# Patient Record
Sex: Male | Born: 1985 | Hispanic: Yes | Marital: Single | Smoking: Former smoker
Health system: Southern US, Community
[De-identification: ages and names within clinical notes are randomized; demographics above are authoritative.]

## PROBLEM LIST (undated history)

## (undated) DIAGNOSIS — G8929 Other chronic pain: Secondary | ICD-10-CM

## (undated) DIAGNOSIS — K5190 Ulcerative colitis, unspecified, without complications: Secondary | ICD-10-CM

## (undated) DIAGNOSIS — R109 Unspecified abdominal pain: Secondary | ICD-10-CM

---

## 2005-12-12 ENCOUNTER — Emergency Department (HOSPITAL_COMMUNITY): Admission: EM | Admit: 2005-12-12 | Discharge: 2005-12-13 | Payer: Self-pay | Admitting: Emergency Medicine

## 2006-10-20 ENCOUNTER — Emergency Department (HOSPITAL_COMMUNITY): Admission: EM | Admit: 2006-10-20 | Discharge: 2006-10-20 | Payer: Self-pay | Admitting: Emergency Medicine

## 2008-03-28 ENCOUNTER — Emergency Department (HOSPITAL_COMMUNITY): Admission: EM | Admit: 2008-03-28 | Discharge: 2008-03-28 | Payer: Self-pay | Admitting: Emergency Medicine

## 2013-10-11 ENCOUNTER — Emergency Department (HOSPITAL_COMMUNITY)
Admission: EM | Admit: 2013-10-11 | Discharge: 2013-10-11 | Disposition: A | Discharge status: Home / Self Care | Payer: Self-pay | Attending: Emergency Medicine | Admitting: Emergency Medicine

## 2013-10-11 ENCOUNTER — Encounter (HOSPITAL_COMMUNITY): Payer: Self-pay | Admitting: Emergency Medicine

## 2013-10-11 DIAGNOSIS — K5289 Other specified noninfective gastroenteritis and colitis: Secondary | ICD-10-CM | POA: Insufficient documentation

## 2013-10-11 DIAGNOSIS — K529 Noninfective gastroenteritis and colitis, unspecified: Secondary | ICD-10-CM

## 2013-10-11 DIAGNOSIS — F17200 Nicotine dependence, unspecified, uncomplicated: Secondary | ICD-10-CM | POA: Insufficient documentation

## 2013-10-11 DIAGNOSIS — R000 Tachycardia, unspecified: Secondary | ICD-10-CM | POA: Insufficient documentation

## 2013-10-11 LAB — POCT I-STAT, CHEM 8
BUN: 13 mg/dL (ref 6–23)
CALCIUM ION: 1.25 mmol/L — AB (ref 1.12–1.23)
CHLORIDE: 101 meq/L (ref 96–112)
Creatinine, Ser: 1.1 mg/dL (ref 0.50–1.35)
GLUCOSE: 80 mg/dL (ref 70–99)
HCT: 47 % (ref 39.0–52.0)
HEMOGLOBIN: 16 g/dL (ref 13.0–17.0)
POTASSIUM: 4.3 meq/L (ref 3.7–5.3)
Sodium: 140 mEq/L (ref 137–147)
TCO2: 29 mmol/L (ref 0–100)

## 2013-10-11 LAB — CG4 I-STAT (LACTIC ACID): Lactic Acid, Venous: 1.59 mmol/L (ref 0.5–2.2)

## 2013-10-11 MED ORDER — ONDANSETRON HCL 4 MG/2ML IJ SOLN
4.0000 mg | Freq: Once | INTRAMUSCULAR | Status: AC
  Administered 2013-10-11: 4 mg via INTRAVENOUS
  Filled 2013-10-11: qty 2

## 2013-10-11 MED ORDER — ONDANSETRON 4 MG PO TBDP: ORAL_TABLET | ORAL | Status: DC

## 2013-10-11 MED ORDER — SODIUM CHLORIDE 0.9 % IV BOLUS (SEPSIS)
2000.0000 mL | Freq: Once | INTRAVENOUS | Status: AC
  Administered 2013-10-11: 2000 mL via INTRAVENOUS

## 2013-10-11 NOTE — ED Notes (Signed)
Pt presents to department for evaluation of nausea/vomiting and L lower quadrant abdominal pain. Ongoing x2 days. Denies urinary symptoms. Pt is conscious alert and oriented x4. States 9/10 abdominal pain upon arrival to ED. No signs of acute distress noted.

## 2013-10-11 NOTE — ED Notes (Signed)
Lactic acid results shown to Dr. Walden 

## 2013-10-11 NOTE — ED Provider Notes (Signed)
CSN: [id number]     Arrival date & time 10/11/13  0707 History   First MD Initiated Contact with Patient 10/11/13 0715     Chief Complaint  Patient presents with  . Emesis  . Abdominal Pain     (Consider location/radiation/quality/duration/timing/severity/associated sxs/prior Treatment) Patient is a 28 y.o. male presenting with vomiting and abdominal pain. The history is provided by the patient.  Emesis Severity:  Moderate Duration:  2 days Timing:  Constant Number of daily episodes:  5-6 Quality:  Stomach contents Able to tolerate:  Liquids How soon after eating does vomiting occur:  5 minutes Progression:  Improving Chronicity:  New Recent urination:  Normal Relieved by:  Nothing Worsened by:  Nothing tried Associated symptoms: no abdominal pain, no diarrhea, no fever, no myalgias and no URI   Abdominal Pain Associated symptoms: vomiting   Associated symptoms: no chest pain, no cough, no diarrhea, no fever and no shortness of breath     History reviewed. No pertinent past medical history. History reviewed. No pertinent past surgical history. History reviewed. No pertinent family history. History  Substance Use Topics  . Smoking status: Current Some Day Smoker    Types: Cigarettes  . Smokeless tobacco: Not on file  . Alcohol Use: Yes     Comment: social    Review of Systems  Constitutional: Negative for fever.  Respiratory: Negative for cough and shortness of breath.   Cardiovascular: Negative for chest pain and leg swelling.  Gastrointestinal: Positive for vomiting. Negative for abdominal pain and diarrhea.  Musculoskeletal: Negative for myalgias.  All other systems reviewed and are negative.      Allergies  Review of patient's allergies indicates no known allergies.  Home Medications  No current outpatient prescriptions on file. BP 120/71  Pulse 117  Temp(Src) 98.5 F (36.9 C) (Oral)  Resp 18  Wt 194 lb (87.998 kg)  SpO2 100% Physical Exam   Constitutional: He is oriented to person, place, and time. He appears well-developed and well-nourished. No distress.  HENT:  Head: Normocephalic and atraumatic.  Mouth/Throat: No oropharyngeal exudate.  Eyes: EOM are normal. Pupils are equal, round, and reactive to light.  Neck: Normal range of motion. Neck supple.  Cardiovascular: Regular rhythm.  Tachycardia present.  Exam reveals no friction rub.   No murmur heard. Pulmonary/Chest: Effort normal and breath sounds normal. No respiratory distress. He has no wheezes. He has no rales.  Abdominal: He exhibits no distension. There is no tenderness. There is no rebound.  Musculoskeletal: Normal range of motion. He exhibits no edema.  Neurological: He is alert and oriented to person, place, and time.  Skin: He is not diaphoretic.    ED Course  Procedures (including critical care time) Labs Review Labs Reviewed - No data to display Imaging Review No results found.  EKG Interpretation   None       MDM   Final diagnoses:  Gastroenteritis    67M presents with vomiting for the past 36 hours. Roughly 12 episodes of vomiting. No abdominal pain. No fevers. No diarrhea. Sick contacts: sister and mother with similar symptoms. No medical problems. Here, afebrile, tachycardic in the 120s on my exam. Belly benign, no focal tenderness. Concern for dehydration, viral gastroenteritis. Will give zofran, fluids, check basic labs.  HR improved after 2 liters. Feeling better. Given zofran, stable for discharge.   Dagmar HaitWilliam Ronan Duecker, MD 10/11/13 [phone]

## 2013-10-11 NOTE — Discharge Instructions (Signed)
Viral Gastroenteritis °Viral gastroenteritis is also known as stomach flu. This condition affects the stomach and intestinal tract. It can cause sudden diarrhea and vomiting. The illness typically lasts 3 to 8 days. Most people develop an immune response that eventually gets rid of the virus. While this natural response develops, the virus can make you quite ill. °CAUSES  °Many different viruses can cause gastroenteritis, such as rotavirus or noroviruses. You can catch one of these viruses by consuming contaminated food or water. You may also catch a virus by sharing utensils or other personal items with an infected person or by touching a contaminated surface. °SYMPTOMS  °The most common symptoms are diarrhea and vomiting. These problems can cause a severe loss of body fluids (dehydration) and a body salt (electrolyte) imbalance. Other symptoms may include: °· Fever. °· Headache. °· Fatigue. °· Abdominal pain. °DIAGNOSIS  °Your caregiver can usually diagnose viral gastroenteritis based on your symptoms and a physical exam. A stool sample may also be taken to test for the presence of viruses or other infections. °TREATMENT  °This illness typically goes away on its own. Treatments are aimed at rehydration. The most serious cases of viral gastroenteritis involve vomiting so severely that you are not able to keep fluids down. In these cases, fluids must be given through an intravenous line (IV). °HOME CARE INSTRUCTIONS  °· Drink enough fluids to keep your urine clear or pale yellow. Drink small amounts of fluids frequently and increase the amounts as tolerated. °· Ask your caregiver for specific rehydration instructions. °· Avoid: °· Foods high in sugar. °· Alcohol. °· Carbonated drinks. °· Tobacco. °· Juice. °· Caffeine drinks. °· Extremely hot or cold fluids. °· Fatty, greasy foods. °· Too much intake of anything at one time. °· Dairy products until 24 to 48 hours after diarrhea stops. °· You may consume probiotics.  Probiotics are active cultures of beneficial bacteria. They may lessen the amount and number of diarrheal stools in adults. Probiotics can be found in yogurt with active cultures and in supplements. °· Wash your hands well to avoid spreading the virus. °· Only take over-the-counter or prescription medicines for pain, discomfort, or fever as directed by your caregiver. Do not give aspirin to children. Antidiarrheal medicines are not recommended. °· Ask your caregiver if you should continue to take your regular prescribed and over-the-counter medicines. °· Keep all follow-up appointments as directed by your caregiver. °SEEK IMMEDIATE MEDICAL CARE IF:  °· You are unable to keep fluids down. °· You do not urinate at least once every 6 to 8 hours. °· You develop shortness of breath. °· You notice blood in your stool or vomit. This may look like coffee grounds. °· You have abdominal pain that increases or is concentrated in one small area (localized). °· You have persistent vomiting or diarrhea. °· You have a fever. °· The patient is a child younger than 3 months, and he or she has a fever. °· The patient is a child older than 3 months, and he or she has a fever and persistent symptoms. °· The patient is a child older than 3 months, and he or she has a fever and symptoms suddenly get worse. °· The patient is a baby, and he or she has no tears when crying. °MAKE SURE YOU:  °· Understand these instructions. °· Will watch your condition. °· Will get help right away if you are not doing well or get worse. °Document Released: 08/18/2005 Document Revised: 11/10/2011 Document Reviewed: 06/04/2011 °  ExitCare Patient Information 2014 [place], [place].   Emergency Department Resource Guide 1) Find a Doctor and Pay Out of Pocket Although you won't have to find out who is covered by your insurance plan, it is a good idea to ask around and get recommendations. You will then need to call the office and see if the doctor you have  chosen will accept you as a new patient and what types of options they offer for patients who are self-pay. Some doctors offer discounts or will set up payment plans for their patients who do not have insurance, but you will need to ask so you aren't surprised when you get to your appointment.  2) Contact Your Local Health Department Not all health departments have doctors that can see patients for sick visits, but many do, so it is worth a call to see if yours does. If you don't know where your local health department is, you can check in your phone book. The CDC also has a tool to help you locate your state's health department, and many state websites also have listings of all of their local health departments.  3) Find a Walk-in Clinic If your illness is not likely to be very severe or complicated, you may want to try a walk in clinic. These are popping up all over the country in pharmacies, drugstores, and shopping centers. They're usually staffed by nurse practitioners or physician assistants that have been trained to treat common illnesses and complaints. They're usually fairly quick and inexpensive. However, if you have serious medical issues or chronic medical problems, these are probably not your best option.  No Primary Care Doctor: - Call Health Connect at  [phone] - they can help you locate a primary care doctor that  accepts your insurance, provides certain services, etc. - Physician Referral Service- [phone]  Chronic Pain Problems: Organization         Address  Phone   Notes  [facility]  [phone] Patients need to be referred by their primary care doctor.   Medication Assistance: Organization         Address  Phone   Notes  [facility] Medication [facility] [address] [place], [place] [address] [phone] --Must be a resident of [facility] -- Must have NO insurance coverage whatsoever (no Medicaid/ Medicare,  etc.) -- The pt. MUST have a primary care doctor that directs their care regularly and follows them in the community   MedAssist  [phone]   [employer]  [phone]    Agencies that provide inexpensive medical care: Organization         Address  Phone   Notes  [facility]  [phone]   [facility]    [phone]   [facility] [address] [place], [place] [address] [phone]) [phone]   [facility] [place] [address], [place] [phone]   Planned Parenthood    [phone]) [phone]   [facility]    [phone]) [phone]   [facility]  [address], [place] Phone:  [phone]) [phone], Fax:  [phone]) [phone] Hours of Operation:  9 am - 6 pm, M-F.  Also accepts Medicaid/Medicare and self-pay.  [facility]  [address], [place] Phone: [phone], Fax: [phone]) [phone]. Hours of Operation:  8:30 am - 5:30 pm, M-F.  Also  accepts Medicaid and self-pay.  [facility] [place] [address], [place] Phone: [phone](336) [phone]   [facility] [address], [place], [place] [phone], Ext. [phone] Mondays & Thursdays: 7-9 AM.  First 15 patients are seen on a first come, first serve basis.    Medicaid-accepting [facility] Providers:  Organization         Address  Phone   Notes  [facility] [address], [place] [phone] Also accepts self-pay patients.  [facility] [address], [place]  [phone]) [phone]   [facility] [address], [place] [phone]   [facility] [address] [place] [address], [place] [phone]) [phone]   Renaye RakersVeita Bland [address], [place]   [phone]) [phone] Only accepts [place] Access [place] patients after they have their name applied to their card.   Self-Pay (no  insurance) in [facility] [place]:  Organization         Address  Phone   Notes  Sickle Cell Patients, [facility] [address], [place] [phone]) [phone]   [facility] [address], [place] [phone]   [facility]  [address], [place] [phone]) [phone]   [facility]/Dr. Osei-Bonsu  [address] [place] [address], [place] or [phone], [place] [phone]) [phone] Phone number for both [place] and [place] locations is the same.  [facility] [address], [place] [phone]) [phone]   [facility] [address] [place] [address], [place] or [address] [phone]   [facility] [address], [place] [phone]) [phone], phone; [phone]) [phone], fax Sees patients 1st and 3rd Saturday of every month.  Must not qualify for public or private insurance (i.e. Medicaid, Medicare, [place] Health Choice, Veterans' Benefits)  Household income should be no more than 200% of the poverty level The clinic cannot treat you if you are pregnant or think you are pregnant  Sexually transmitted diseases are not treated at the clinic.    Dental Care: Organization         Address  Phone  Notes  [facility] Department of [facility] [address], [place] [phone]) [phone] Accepts children up to age 28 who are enrolled in [place]icaid or [place] Health Choice; pregnant women with a Medicaid card; and children who have applied for Medicaid or [place] Health Choice, but were declined, whose parents can pay a reduced fee at time of service.  [facility] Department of [facility] [place]  [address], [place] [phone]) [phone] Accepts children up to age 28 who are enrolled in [place]icaid or [place] Health Choice; pregnant women with a Medicaid card; and children who have applied for Medicaid or [place] Health Choice, but were  declined, whose parents can pay a reduced fee at time of service.  [facility]  [address], [place] [phone] Patients are seen by appointment only. Walk-ins are not accepted. [facility] will see patients 28 years of age and older. Monday - Tuesday (8am-5pm) Most Wednesdays (8:30-5pm) $30 per visit, cash only  [facility]  [address], [facility] [phone] Patients are seen by appointment only. Walk-ins are not accepted. [facility] will  see patients 28 years of age and older. One Wednesday Evening (Monthly: Volunteer Based).  $30 per visit, cash only  [employer] [facility]  [phone]) [phone] for adults; Children under age 504, call Graduate Pediatric Dentistry at [phone]. Children aged 514-14, please call [phone]) [phone] to request a pediatric application.  Dental services are provided in all areas of dental care including fillings, crowns and bridges, complete and partial dentures, implants, gum treatment, root canals, and extractions. Preventive care is also provided. Treatment is provided to both adults and children. Patients are selected via a lottery and there is often a waiting list.   [facility] [address], [place]  [phone]) [phone] [email or web address]   [facility] [address], [place], [place] [phone], Ext. [phone] Second and Fourth Thursday of each month, opens at 6:30 AM; Clinic ends at 9 AM.  Patients are seen on a first-come first-served basis, and a limited number are seen during each clinic.   [facility]  [address], [place], [place] [phone]) [phone]   Eligibility Requirements You must have lived in [place], [place], or [place] counties for at least the last three months.   You cannot be eligible for state or federal sponsored National Cityhealthcare insurance, including CIGNAVeterans Administration, [place]icaid, or Harrah[employer] EntertainmentMedicare.   You generally cannot be  eligible for healthcare insurance through your employer.    How to apply: Eligibility screenings are held every Tuesday and Wednesday afternoon from 1:00 pm until 4:00 pm. You do not need an appointment for the interview!  [facility] [address], [place], [place] [phone]   [facility]  [phone]   [facility]  [phone]   [facility]  [phone]    Behavioral Health Resources in the Community: Intensive Outpatient Programs Organization         Address  Phone  Notes  [facility] [address], [place], [place] [phone]   [facility] [address], [place], [place] [phone]   [facility] [address], [place], [place]  [phone]   [facility] [address],  [place], [place] [phone] or [phone]   Substance Abuse Resources Organization         Address  Phone  Notes  [facility] Drug Services  [phone]   [facility]  [phone]   [facility]  [phone]   [facility]  [phone]   Residential & Outpatient Substance Abuse Program  [phone]   Psychological Services Organization         Address  Phone  Notes  [facility]  [phone]- [phone]   [facility]  [phone]   [facility] [address], [place] [phone] or [phone]    Mobile Crisis Teams Organization         Address  Phone  Notes  [facility], Mobile Crisis Care Unit  [phone]   [facility]  [address] [place], [place] [phone]   Doristine LocksSharon DeEsch [address] [place] [phone]    Self-Help/Support Groups Organization         Address  Phone             Notes  [facility] [place] - variety of support groups  336- [phone] Call for more information    Narcotics Anonymous (NA), [facility] [address], [place]  2 meetings at this location   Residential Treatment Programs Organization         Address  Phone  Notes  [facility] [address],    [place]  [phone]   [facility]  [address], [place] [address], [place], [place] [phone]   [facility] [address], [place] [phone] Admissions: 8am-3pm M-F  [facility] [address]    [place], [place] [phone]   [facility] [address], [place], [place] [phone]   [facility] [address]  [place], [place] [phone]   [facility] - Intensive Outpatient [address], [place], [place] [phone]   [facility] ([facility] [address]  [place], [place] [phone] or [phone]   [facility] (RTS) [address] [place], [place] [phone] Accepts Medicaid  [facility] [address]  [place] [phone] Substance Abuse/Addiction Treatment   [facility] Behavioral Health Resources Organization         Address  Phone  Notes  [facility]  [phone]) [phone]   Angie FavaJulie Brannon, PhD [address], [address] [place], [place]   [phone] or [phone]   [facility]   [address] [place], [place] [phone]) [phone]   [facility] [address], [place], [place] [phone] Insurance/Medicaid/sponsorship through [facility]  [facility] [address]                                    [place], [place] [phone] Therapy/tele-psych/case  [facility] [address].   [place], [place] [phone]) [phone]    Dr. Lolly MustacheArfeen  [phone]   [facility] [place]  United [employer] [facility] 1) [address], [place] 2) [address], [place] 3)  [address], [place] [phone](336) [phone])  [phone]  [phone]   [facility] Child Abuse Hotline [phone]) [phone] or [phone]) [phone] (After Hours)

## 2013-10-14 ENCOUNTER — Encounter (HOSPITAL_COMMUNITY): Payer: Self-pay | Admitting: Emergency Medicine

## 2013-10-14 ENCOUNTER — Emergency Department (HOSPITAL_COMMUNITY)
Admission: EM | Admit: 2013-10-14 | Discharge: 2013-10-14 | Disposition: A | Discharge status: Home / Self Care | Payer: Self-pay | Attending: Emergency Medicine | Admitting: Emergency Medicine

## 2013-10-14 DIAGNOSIS — R1032 Left lower quadrant pain: Secondary | ICD-10-CM | POA: Insufficient documentation

## 2013-10-14 DIAGNOSIS — R197 Diarrhea, unspecified: Secondary | ICD-10-CM | POA: Insufficient documentation

## 2013-10-14 DIAGNOSIS — R112 Nausea with vomiting, unspecified: Secondary | ICD-10-CM | POA: Insufficient documentation

## 2013-10-14 DIAGNOSIS — F17200 Nicotine dependence, unspecified, uncomplicated: Secondary | ICD-10-CM | POA: Insufficient documentation

## 2013-10-14 LAB — COMPREHENSIVE METABOLIC PANEL
ALBUMIN: 4 g/dL (ref 3.5–5.2)
ALT: 42 U/L (ref 0–53)
AST: 27 U/L (ref 0–37)
Alkaline Phosphatase: 62 U/L (ref 39–117)
BILIRUBIN TOTAL: 0.7 mg/dL (ref 0.3–1.2)
BUN: 13 mg/dL (ref 6–23)
CHLORIDE: 104 meq/L (ref 96–112)
CO2: 29 mEq/L (ref 19–32)
Calcium: 9.3 mg/dL (ref 8.4–10.5)
Creatinine, Ser: 1.09 mg/dL (ref 0.50–1.35)
GFR calc Af Amer: >90 mL/min (ref >90)
GFR calc non Af Amer: >90 mL/min (ref >90)
Glucose, Bld: 78 mg/dL (ref 70–99)
POTASSIUM: 4.4 meq/L (ref 3.7–5.3)
SODIUM: 144 meq/L (ref 137–147)
TOTAL PROTEIN: 7.3 g/dL (ref 6.0–8.3)

## 2013-10-14 LAB — CBC WITH DIFFERENTIAL/PLATELET
BASOS ABS: 0 10*3/uL (ref 0.0–0.1)
Basophils Relative: 1 % (ref 0–1)
EOS ABS: 0.2 10*3/uL (ref 0.0–0.7)
Eosinophils Relative: 5 % (ref 0–5)
HCT: 45.2 % (ref 39.0–52.0)
Hemoglobin: 15.2 g/dL (ref 13.0–17.0)
Lymphocytes Relative: 37 % (ref 12–46)
Lymphs Abs: 1.9 10*3/uL (ref 0.7–4.0)
MCH: 26.5 pg (ref 26.0–34.0)
MCHC: 33.6 g/dL (ref 30.0–36.0)
MCV: 78.9 fL (ref 78.0–100.0)
Monocytes Absolute: 0.4 10*3/uL (ref 0.1–1.0)
Monocytes Relative: 8 % (ref 3–12)
NEUTROS ABS: 2.6 10*3/uL (ref 1.7–7.7)
Neutrophils Relative %: 50 % (ref 43–77)
PLATELETS: 257 10*3/uL (ref 150–400)
RBC: 5.73 MIL/uL (ref 4.22–5.81)
RDW: 13.5 % (ref 11.5–15.5)
WBC: 5.1 10*3/uL (ref 4.0–10.5)

## 2013-10-14 LAB — LIPASE, BLOOD: Lipase: 28 U/L (ref 11–59)

## 2013-10-14 MED ORDER — ONDANSETRON HCL 4 MG PO TABS: 4.0000 mg | ORAL_TABLET | Freq: Four times a day (QID) | ORAL | Status: DC

## 2013-10-14 MED ORDER — ONDANSETRON 4 MG PO TBDP
4.0000 mg | ORAL_TABLET | Freq: Once | ORAL | Status: AC
  Administered 2013-10-14: 4 mg via ORAL
  Filled 2013-10-14: qty 1

## 2013-10-14 NOTE — ED Notes (Signed)
Vomiting since Sunday.  He was seen here for the same Monday.  He can keeps liquids but not food.  Diarrhea yesterday not today

## 2013-10-14 NOTE — ED Provider Notes (Signed)
TIME SEEN: 10:30 PM  CHIEF COMPLAINT: Nausea, vomiting, diarrhea  HPI: Pt is a 28 y.o. male with no significant past medical history who presents the emergency room with 3 days of intermittent nausea, vomiting and diarrhea. He is having diffuse abdominal cramping. No bloody stools or melena. He reports he is back here today because he needs a work note and his mother who is a Engineer, civil (consulting)nurse was concerned. He denies any current abdominal pain. No fevers or chills. No sick contacts. No recent international travel. He reports he has had 2 episodes of vomiting today.  ROS: See HPI Constitutional: no fever  Eyes: no drainage  ENT: no runny nose   Cardiovascular:  no chest pain  Resp: no SOB  GI:  vomiting GU: no dysuria Integumentary: no rash  Allergy: no hives  Musculoskeletal: no leg swelling  Neurological: no slurred speech ROS otherwise negative  PAST MEDICAL HISTORY/PAST SURGICAL HISTORY:  History reviewed. No pertinent past medical history.  MEDICATIONS:  Prior to Admission medications   Medication Sig Start Date End Date Taking? Authorizing Provider  aspirin-acetaminophen-caffeine (EXCEDRIN MIGRAINE) [phone] MG per tablet Take 2 tablets by mouth every 6 (six) hours as needed for headache.   Yes Historical Provider, MD    ALLERGIES:  No Known Allergies  SOCIAL HISTORY:  History  Substance Use Topics  . Smoking status: Current Some Day Smoker    Types: Cigarettes  . Smokeless tobacco: Not on file  . Alcohol Use: Yes     Comment: social    FAMILY HISTORY: History reviewed. No pertinent family history.  EXAM: BP 114/67  Pulse 64  Temp(Src) 98.1 F (36.7 C) (Oral)  Resp 18  SpO2 99% CONSTITUTIONAL: Alert and oriented and responds appropriately to questions. Well-appearing; well-nourished HEAD: Normocephalic EYES: Conjunctivae clear, PERRL ENT: normal nose; no rhinorrhea; moist mucous membranes; pharynx without lesions noted NECK: Supple, no meningismus, no LAD  CARD:  RRR; S1 and S2 appreciated; no murmurs, no clicks, no rubs, no gallops RESP: Normal chest excursion without splinting or tachypnea; breath sounds clear and equal bilaterally; no wheezes, no rhonchi, no rales,  ABD/GI: Normal bowel sounds; non-distended; soft, non-tender, no rebound, no guarding BACK:  The back appears normal and is non-tender to palpation, there is no CVA tenderness EXT: Normal ROM in all joints; non-tender to palpation; no edema; normal capillary refill; no cyanosis    SKIN: Normal color for age and race; warm NEURO: Moves all extremities equally PSYCH: The patient's mood and manner are appropriate. Grooming and personal hygiene are appropriate.  MEDICAL DECISION MAKING: Patient here with likely viral gastroenteritis. He is hemodynamically stable. He is only had 2 episodes of vomiting today. His abdominal exam is completely benign. His labs are reassuring. No signs of dehydration. He feels much better after oral Zofran. He has been able to tolerate by mouth liquids. We'll discharge him with return precautions, work note, prescription for Zofran.       Layla MawKristen N Ward, DO 10/15/13 0025

## 2013-10-14 NOTE — ED Notes (Signed)
Pt has drank one cup of water and has no nausea or vomiting.

## 2013-10-14 NOTE — Discharge Instructions (Signed)
Viral Gastroenteritis Viral gastroenteritis is also known as stomach flu. This condition affects the stomach and intestinal tract. It can cause sudden diarrhea and vomiting. The illness typically lasts 3 to 8 days. Most people develop an immune response that eventually gets rid of the virus. While this natural response develops, the virus can make you quite ill. CAUSES  Many different viruses can cause gastroenteritis, such as rotavirus or noroviruses. You can catch one of these viruses by consuming contaminated food or water. You may also catch a virus by sharing utensils or other personal items with an infected person or by touching a contaminated surface. SYMPTOMS  The most common symptoms are diarrhea and vomiting. These problems can cause a severe loss of body fluids (dehydration) and a body salt (electrolyte) imbalance. Other symptoms may include:  Fever.  Headache.  Fatigue.  Abdominal pain. DIAGNOSIS  Your caregiver can usually diagnose viral gastroenteritis based on your symptoms and a physical exam. A stool sample may also be taken to test for the presence of viruses or other infections. TREATMENT  This illness typically goes away on its own. Treatments are aimed at rehydration. The most serious cases of viral gastroenteritis involve vomiting so severely that you are not able to keep fluids down. In these cases, fluids must be given through an intravenous line (IV). HOME CARE INSTRUCTIONS   Drink enough fluids to keep your urine clear or pale yellow. Drink small amounts of fluids frequently and increase the amounts as tolerated.  Ask your caregiver for specific rehydration instructions.  Avoid:  Foods high in sugar.  Alcohol.  Carbonated drinks.  Tobacco.  Juice.  Caffeine drinks.  Extremely hot or cold fluids.  Fatty, greasy foods.  Too much intake of anything at one time.  Dairy products until 24 to 48 hours after diarrhea stops.  You may consume probiotics.  Probiotics are active cultures of beneficial bacteria. They may lessen the amount and number of diarrheal stools in adults. Probiotics can be found in yogurt with active cultures and in supplements.  Wash your hands well to avoid spreading the virus.  Only take over-the-counter or prescription medicines for pain, discomfort, or fever as directed by your caregiver. Do not give aspirin to children. Antidiarrheal medicines are not recommended.  Ask your caregiver if you should continue to take your regular prescribed and over-the-counter medicines.  Keep all follow-up appointments as directed by your caregiver. SEEK IMMEDIATE MEDICAL CARE IF:   You are unable to keep fluids down.  You do not urinate at least once every 6 to 8 hours.  You develop shortness of breath.  You notice blood in your stool or vomit. This may look like coffee grounds.  You have abdominal pain that increases or is concentrated in one small area (localized).  You have persistent vomiting or diarrhea.  You have a fever.  The patient is a child younger than 3 months, and he or she has a fever.  The patient is a child older than 3 months, and he or she has a fever and persistent symptoms.  The patient is a child older than 3 months, and he or she has a fever and symptoms suddenly get worse.  The patient is a baby, and he or she has no tears when crying. MAKE SURE YOU:   Understand these instructions.  Will watch your condition.  Will get help right away if you are not doing well or get worse. Document Released: 08/18/2005 Document Revised: 11/10/2011 Document Reviewed: 06/04/2011   Exit[employer] Patient Information 2014 [employer].  

## 2013-10-14 NOTE — ED Notes (Signed)
Pt in with LLQ pain, and vomiting x3 days, seen here Tuesday for the same.

## 2014-11-14 ENCOUNTER — Emergency Department (HOSPITAL_BASED_OUTPATIENT_CLINIC_OR_DEPARTMENT_OTHER)
Admission: EM | Admit: 2014-11-14 | Discharge: 2014-11-14 | Disposition: A | Discharge status: Home / Self Care | Payer: Self-pay | Attending: Emergency Medicine | Admitting: Emergency Medicine

## 2014-11-14 ENCOUNTER — Encounter (HOSPITAL_BASED_OUTPATIENT_CLINIC_OR_DEPARTMENT_OTHER): Payer: Self-pay | Admitting: *Deleted

## 2014-11-14 DIAGNOSIS — R1084 Generalized abdominal pain: Secondary | ICD-10-CM

## 2014-11-14 DIAGNOSIS — K921 Melena: Secondary | ICD-10-CM | POA: Insufficient documentation

## 2014-11-14 LAB — CBC WITH DIFFERENTIAL/PLATELET
Basophils Absolute: 0 10*3/uL (ref 0.0–0.1)
Basophils Relative: 0 % (ref 0–1)
Eosinophils Absolute: 0.3 10*3/uL (ref 0.0–0.7)
Eosinophils Relative: 3 % (ref 0–5)
HCT: 40.9 % (ref 39.0–52.0)
HEMOGLOBIN: 13.9 g/dL (ref 13.0–17.0)
LYMPHS ABS: 1.2 10*3/uL (ref 0.7–4.0)
LYMPHS PCT: 14 % (ref 12–46)
MCH: 26.3 pg (ref 26.0–34.0)
MCHC: 34 g/dL (ref 30.0–36.0)
MCV: 77.3 fL — ABNORMAL LOW (ref 78.0–100.0)
MONOS PCT: 6 % (ref 3–12)
Monocytes Absolute: 0.5 10*3/uL (ref 0.1–1.0)
NEUTROS ABS: 6.4 10*3/uL (ref 1.7–7.7)
Neutrophils Relative %: 77 % (ref 43–77)
PLATELETS: 198 10*3/uL (ref 150–400)
RBC: 5.29 MIL/uL (ref 4.22–5.81)
RDW: 13.7 % (ref 11.5–15.5)
WBC: 8.4 10*3/uL (ref 4.0–10.5)

## 2014-11-14 LAB — COMPREHENSIVE METABOLIC PANEL
ALBUMIN: 3.7 g/dL (ref 3.5–5.2)
ALT: 18 U/L (ref 0–53)
ANION GAP: 8 (ref 5–15)
AST: 23 U/L (ref 0–37)
Alkaline Phosphatase: 47 U/L (ref 39–117)
BILIRUBIN TOTAL: 1.1 mg/dL (ref 0.3–1.2)
BUN: 8 mg/dL (ref 6–23)
CHLORIDE: 107 mmol/L (ref 96–112)
CO2: 26 mmol/L (ref 19–32)
Calcium: 9 mg/dL (ref 8.4–10.5)
Creatinine, Ser: 0.89 mg/dL (ref 0.50–1.35)
GFR calc Af Amer: >90 mL/min (ref >90)
GFR calc non Af Amer: >90 mL/min (ref >90)
Glucose, Bld: 109 mg/dL — ABNORMAL HIGH (ref 70–99)
POTASSIUM: 4 mmol/L (ref 3.5–5.1)
SODIUM: 141 mmol/L (ref 135–145)
TOTAL PROTEIN: 6.2 g/dL (ref 6.0–8.3)

## 2014-11-14 LAB — URINALYSIS, ROUTINE W REFLEX MICROSCOPIC
Bilirubin Urine: NEGATIVE
GLUCOSE, UA: NEGATIVE mg/dL
HGB URINE DIPSTICK: NEGATIVE
Ketones, ur: NEGATIVE mg/dL
Leukocytes, UA: NEGATIVE
Nitrite: NEGATIVE
Protein, ur: NEGATIVE mg/dL
Specific Gravity, Urine: 1.013 (ref 1.005–1.030)
UROBILINOGEN UA: 0.2 mg/dL (ref 0.0–1.0)
pH: 7.5 (ref 5.0–8.0)

## 2014-11-14 LAB — LIPASE, BLOOD: Lipase: 28 U/L (ref 11–59)

## 2014-11-14 MED ORDER — METRONIDAZOLE 500 MG PO TABS
500.0000 mg | ORAL_TABLET | Freq: Once | ORAL | Status: AC
  Administered 2014-11-14: 500 mg via ORAL
  Filled 2014-11-14: qty 1

## 2014-11-14 MED ORDER — CIPROFLOXACIN HCL 500 MG PO TABS
500.0000 mg | ORAL_TABLET | Freq: Once | ORAL | Status: AC
  Administered 2014-11-14: 500 mg via ORAL
  Filled 2014-11-14: qty 1

## 2014-11-14 MED ORDER — METRONIDAZOLE 500 MG PO TABS
500.0000 mg | ORAL_TABLET | Freq: Three times a day (TID) | ORAL | Status: DC
Start: 2014-11-14 — End: 2015-03-20

## 2014-11-14 MED ORDER — CIPROFLOXACIN HCL 500 MG PO TABS: 500.0000 mg | ORAL_TABLET | Freq: Two times a day (BID) | ORAL | Status: DC

## 2014-11-14 NOTE — ED Notes (Signed)
MD at bedside. 

## 2014-11-14 NOTE — ED Provider Notes (Signed)
CSN: [id number]     Arrival date & time 11/14/14  1650 History   First MD Initiated Contact with Patient 11/14/14 1653     Chief Complaint  Patient presents with  . Rectal Bleeding     (Consider location/radiation/quality/duration/timing/severity/associated sxs/prior Treatment) HPI Comments: Patient with no significant past medical history but with intermittent blood in stools for the past 3 years presents with complaint of bloody stools, diarrhea and abdominal pain for the past several days. Patient has never had pain in his abdomen with bloody stools. Stools are also watery with some mucous. He describes it as sharp on the left side and the upper abdomen, unbearable earlier today. No fevers, nausea or vomiting. No urinary symptoms. No chest pain or shortness of breath. No treatments prior to arrival. No history of IBD.     The history is provided by the patient.    History reviewed. No pertinent past medical history. History reviewed. No pertinent past surgical history. No family history on file. History  Substance Use Topics  . Smoking status: Former Games developermoker  . Smokeless tobacco: Not on file  . Alcohol Use: Yes     Comment: social    Review of Systems  Constitutional: Negative for fever.  HENT: Negative for rhinorrhea and sore throat.   Eyes: Negative for redness.  Respiratory: Negative for cough.   Cardiovascular: Negative for chest pain.  Gastrointestinal: Positive for abdominal pain, diarrhea and blood in stool. Negative for nausea, vomiting and constipation.  Genitourinary: Negative for dysuria.  Musculoskeletal: Negative for myalgias.  Skin: Negative for rash.  Neurological: Negative for headaches.    Allergies  Review of patient's allergies indicates no known allergies.  Home Medications   Prior to Admission medications   Medication Sig Start Date End Date Taking? Authorizing Provider  aspirin-acetaminophen-caffeine (EXCEDRIN MIGRAINE) [id number] MG per tablet  Take 2 tablets by mouth every 6 (six) hours as needed for headache.    Historical Provider, MD  ondansetron (ZOFRAN) 4 MG tablet Take 1 tablet (4 mg total) by mouth every 6 (six) hours. 10/14/13   Kristen N Ward, DO   BP 124/83 mmHg  Pulse 72  Temp(Src) 97.6 F (36.4 C) (Oral)  Resp 20  Ht 6' (1.829 m)  Wt 185 lb (83.915 kg)  BMI 25.08 kg/m2  SpO2 100%   Physical Exam  Constitutional: He appears well-developed and well-nourished.  HENT:  Head: Normocephalic and atraumatic.  Mouth/Throat: Oropharynx is clear and moist.  Eyes: Conjunctivae are normal. Right eye exhibits no discharge. Left eye exhibits no discharge.  Neck: Normal range of motion. Neck supple.  Cardiovascular: Normal rate, regular rhythm and normal heart sounds.   Pulmonary/Chest: Effort normal and breath sounds normal.  Abdominal: Soft. Bowel sounds are normal. He exhibits no distension. There is tenderness in the epigastric area, periumbilical area, left upper quadrant and left lower quadrant. There is no rigidity, no rebound and no guarding.    Genitourinary: Rectum normal. Rectal exam shows no external hemorrhoid, no internal hemorrhoid, no fissure, no mass, no tenderness and anal tone normal.  No stool in rectal vault. Hemoccult card not sent due to lack of sample.   Neurological: He is alert.  Skin: Skin is warm and dry.  Psychiatric: He has a normal mood and affect.  Nursing note and vitals reviewed.   ED Course  Procedures (including critical care time) Labs Review Labs Reviewed  CBC WITH DIFFERENTIAL/PLATELET - Abnormal; Notable for the following:    MCV 77.3 (*)  All other components within normal limits  COMPREHENSIVE METABOLIC PANEL - Abnormal; Notable for the following:    Glucose, Bld 109 (*)    All other components within normal limits  LIPASE, BLOOD  URINALYSIS, ROUTINE W REFLEX MICROSCOPIC    Imaging Review No results found.   EKG Interpretation None       5:32 PM Patient seen and  examined. Work-up initiated.   Vital signs reviewed and are as follows: BP 124/83 mmHg  Pulse 72  Temp(Src) 97.6 F (36.4 C) (Oral)  Resp 20  Ht 6' (1.829 m)  Wt 185 lb (83.915 kg)  BMI 25.08 kg/m2  SpO2 100%  6:35 PM Rectal exam performed with nurse chaperone. No stool in rectal vault, no sample obtained for hemoccult testing. Patient discussed with Dr. Gwendolyn Grant who will see.   7:15 PM Pt seen by Dr. Gwendolyn Grant. Will treat with empiric abx.   The patient was urged to return to the Emergency Department immediately with worsening of current symptoms, worsening abdominal pain, persistent vomiting, blood noted in stools, fever, or any other concerns. The patient verbalized understanding.   PCP and GI referrals given.    MDM   Final diagnoses:  Generalized abdominal pain  Hematochezia   Patient with abdominal pain and bloody diarrhea, most consistent with colitis. Labs are reassuring. Pain is controlled without treatment. Patient has been tolerating orals. Given patient appearance and workup, do not feel that CT exam is indicated at this time. Will treat with him empiric antibiotics and have patient follow-up with PCP/GI for further evaluation. Appropriate return instructions discussed with patient. He appears well, non-toxic.   Vitals are stable, no fever.  No signs of dehydration, tolerating PO's. Lungs are clear. Doubt appendicitis, cholecystitis, pancreatitis, ruptured viscus, UTI, kidney stone. Supportive therapy indicated with return if symptoms worsen. Patient counseled.   No dangerous or life-threatening conditions suspected or identified by history, physical exam, and by work-up. No indications for hospitalization identified.     Renne Crigler, PA-C 11/14/14 1918  Elwin Mocha, MD 11/14/14 2123

## 2014-11-14 NOTE — ED Notes (Addendum)
Rectal bleeding and pain. Diarrhea stools. Bleeding on and off since 2013 since being incarcerated.

## 2014-11-14 NOTE — Discharge Instructions (Signed)
Please read and follow all provided instructions.  Your diagnoses today include:  1. Generalized abdominal pain   2. Hematochezia     Tests performed today include:  Blood counts and electrolytes  Blood tests to check liver and kidney function  Blood tests to check pancreas function  Urine test to look for infection  Vital signs. See below for your results today.   Medications prescribed:   Ciprofloxacin - antibiotic  You have been prescribed an antibiotic medicine: take the entire course of medicine even if you are feeling better. Stopping early can cause the antibiotic not to work.   Metronidazole - antibiotic  You have been prescribed an antibiotic medicine: take the entire course of medicine even if you are feeling better. Stopping early can cause the antibiotic not to work. Do not drink alcohol when taking this medication.   Take any prescribed medications only as directed.  Home care instructions:   Follow any educational materials contained in this packet.  Follow-up instructions: Please follow-up with your primary care provider in the next 3 days for further evaluation of your symptoms.    Return instructions:  SEEK IMMEDIATE MEDICAL ATTENTION IF:  The pain does not go away or becomes severe   A temperature above 101F develops   Repeated vomiting occurs (multiple episodes)   The pain becomes localized to portions of the abdomen. The right side could possibly be appendicitis. In an adult, the left lower portion of the abdomen could be colitis or diverticulitis.   Blood is being passed in stools or vomit (bright red or black tarry stools)   You develop chest pain, difficulty breathing, dizziness or fainting, or become confused, poorly responsive, or inconsolable (young children)  If you have any other emergent concerns regarding your health  Additional Information: Abdominal (belly) pain can be caused by many things. Your caregiver performed an  examination and possibly ordered blood/urine tests and imaging (CT scan, x-rays, ultrasound). Many cases can be observed and treated at home after initial evaluation in the emergency department. Even though you are being discharged home, abdominal pain can be unpredictable. Therefore, you need a repeated exam if your pain does not resolve, returns, or worsens. Most patients with abdominal pain don't have to be admitted to the hospital or have surgery, but serious problems like appendicitis and gallbladder attacks can start out as nonspecific pain. Many abdominal conditions cannot be diagnosed in one visit, so follow-up evaluations are very important.  Your vital signs today were: BP 124/83 mmHg   Pulse 72   Temp(Src) 97.6 F (36.4 C) (Oral)   Resp 20   Ht 6' (1.829 m)   Wt 185 lb (83.915 kg)   BMI 25.08 kg/m2   SpO2 100% If your blood pressure (bp) was elevated above 135/85 this visit, please have this repeated by your doctor within one month. --------------

## 2014-11-17 ENCOUNTER — Encounter (HOSPITAL_COMMUNITY): Payer: Self-pay | Admitting: Emergency Medicine

## 2014-11-17 ENCOUNTER — Emergency Department (HOSPITAL_COMMUNITY)
Admission: EM | Admit: 2014-11-17 | Discharge: 2014-11-17 | Disposition: A | Discharge status: Home / Self Care | Payer: Self-pay | Attending: Emergency Medicine | Admitting: Emergency Medicine

## 2014-11-17 ENCOUNTER — Emergency Department (HOSPITAL_COMMUNITY): Payer: Self-pay

## 2014-11-17 DIAGNOSIS — Z8719 Personal history of other diseases of the digestive system: Secondary | ICD-10-CM | POA: Insufficient documentation

## 2014-11-17 DIAGNOSIS — Z7982 Long term (current) use of aspirin: Secondary | ICD-10-CM | POA: Insufficient documentation

## 2014-11-17 DIAGNOSIS — R109 Unspecified abdominal pain: Secondary | ICD-10-CM | POA: Insufficient documentation

## 2014-11-17 DIAGNOSIS — R110 Nausea: Secondary | ICD-10-CM | POA: Insufficient documentation

## 2014-11-17 DIAGNOSIS — Z79899 Other long term (current) drug therapy: Secondary | ICD-10-CM | POA: Insufficient documentation

## 2014-11-17 DIAGNOSIS — Z87891 Personal history of nicotine dependence: Secondary | ICD-10-CM | POA: Insufficient documentation

## 2014-11-17 HISTORY — DX: Ulcerative colitis, unspecified, without complications: K51.90

## 2014-11-17 LAB — COMPREHENSIVE METABOLIC PANEL
ALT: 18 U/L (ref 0–53)
AST: 19 U/L (ref 0–37)
Albumin: 3.8 g/dL (ref 3.5–5.2)
Alkaline Phosphatase: 44 U/L (ref 39–117)
Anion gap: 5 (ref 5–15)
BUN: 13 mg/dL (ref 6–23)
CO2: 26 mmol/L (ref 19–32)
Calcium: 9.1 mg/dL (ref 8.4–10.5)
Chloride: 109 mmol/L (ref 96–112)
Creatinine, Ser: 0.86 mg/dL (ref 0.50–1.35)
GFR calc Af Amer: >90 mL/min (ref >90)
Glucose, Bld: 109 mg/dL — ABNORMAL HIGH (ref 70–99)
Potassium: 3.7 mmol/L (ref 3.5–5.1)
SODIUM: 140 mmol/L (ref 135–145)
TOTAL PROTEIN: 6.5 g/dL (ref 6.0–8.3)
Total Bilirubin: 0.9 mg/dL (ref 0.3–1.2)

## 2014-11-17 LAB — URINALYSIS, ROUTINE W REFLEX MICROSCOPIC
BILIRUBIN URINE: NEGATIVE
Glucose, UA: NEGATIVE mg/dL
Hgb urine dipstick: NEGATIVE
Ketones, ur: NEGATIVE mg/dL
Leukocytes, UA: NEGATIVE
NITRITE: NEGATIVE
Protein, ur: NEGATIVE mg/dL
SPECIFIC GRAVITY, URINE: 1.019 (ref 1.005–1.030)
UROBILINOGEN UA: 0.2 mg/dL (ref 0.0–1.0)
pH: 7 (ref 5.0–8.0)

## 2014-11-17 LAB — CBC WITH DIFFERENTIAL/PLATELET
Basophils Absolute: 0 10*3/uL (ref 0.0–0.1)
Basophils Relative: 1 % (ref 0–1)
Eosinophils Absolute: 0.2 10*3/uL (ref 0.0–0.7)
Eosinophils Relative: 4 % (ref 0–5)
HCT: 41.7 % (ref 39.0–52.0)
Hemoglobin: 13.8 g/dL (ref 13.0–17.0)
LYMPHS PCT: 22 % (ref 12–46)
Lymphs Abs: 1.4 10*3/uL (ref 0.7–4.0)
MCH: 26 pg (ref 26.0–34.0)
MCHC: 33.1 g/dL (ref 30.0–36.0)
MCV: 78.5 fL (ref 78.0–100.0)
MONOS PCT: 5 % (ref 3–12)
Monocytes Absolute: 0.3 10*3/uL (ref 0.1–1.0)
Neutro Abs: 4.4 10*3/uL (ref 1.7–7.7)
Neutrophils Relative %: 70 % (ref 43–77)
PLATELETS: 212 10*3/uL (ref 150–400)
RBC: 5.31 MIL/uL (ref 4.22–5.81)
RDW: 13.7 % (ref 11.5–15.5)
WBC: 6.3 10*3/uL (ref 4.0–10.5)

## 2014-11-17 LAB — LIPASE, BLOOD: LIPASE: 24 U/L (ref 11–59)

## 2014-11-17 MED ORDER — ONDANSETRON HCL 4 MG/2ML IJ SOLN
4.0000 mg | Freq: Once | INTRAMUSCULAR | Status: AC
  Administered 2014-11-17: 4 mg via INTRAVENOUS
  Filled 2014-11-17: qty 2

## 2014-11-17 MED ORDER — MORPHINE SULFATE 4 MG/ML IJ SOLN
4.0000 mg | Freq: Once | INTRAMUSCULAR | Status: AC
  Administered 2014-11-17: 4 mg via INTRAVENOUS
  Filled 2014-11-17: qty 1

## 2014-11-17 MED ORDER — HYDROCODONE-IBUPROFEN 7.5-200 MG PO TABS: 1.0000 | ORAL_TABLET | Freq: Four times a day (QID) | ORAL | Status: DC | PRN

## 2014-11-17 MED ORDER — SODIUM CHLORIDE 0.9 % IV BOLUS (SEPSIS)
1000.0000 mL | Freq: Once | INTRAVENOUS | Status: AC
  Administered 2014-11-17: 1000 mL via INTRAVENOUS

## 2014-11-17 MED ORDER — IOHEXOL 300 MG/ML  SOLN
50.0000 mL | Freq: Once | INTRAMUSCULAR | Status: AC | PRN
  Administered 2014-11-17: 50 mL via ORAL

## 2014-11-17 MED ORDER — HYDROCODONE-ACETAMINOPHEN 5-325 MG PO TABS: 1.0000 | ORAL_TABLET | ORAL | Status: DC | PRN

## 2014-11-17 MED ORDER — IOHEXOL 300 MG/ML  SOLN
100.0000 mL | Freq: Once | INTRAMUSCULAR | Status: AC | PRN
  Administered 2014-11-17: 100 mL via INTRAVENOUS

## 2014-11-17 NOTE — ED Notes (Addendum)
Per [facility], patient from home. Patient states he was diagnosed with ulcerative colitis on Tuesday at [facility]. Patient states he is having bloody emesis and diarrhea x12+ episodes per day. Patient was given Cipro and Flagyl, states he is taking them. Patient c/o severe 10/10 left sided abd pain. Patient alert and oriented x4. Patient states he took 1 motrin at 2100 for pain. [facility], has not made contact with them at this time.

## 2014-11-17 NOTE — ED Notes (Signed)
Bed: WA06 Expected date:  Expected time:  Means of arrival:  Comments: EMS GI bleed

## 2014-11-17 NOTE — ED Notes (Signed)
Patient transported to CT 

## 2014-11-17 NOTE — ED Notes (Signed)
Pt stated he is unable to urinate at this time 

## 2014-11-17 NOTE — ED Provider Notes (Signed)
CSN: [id number]     Arrival date & time 11/17/14  0140 History   First MD Initiated Contact with Patient 11/17/14 [phone]     Chief Complaint  Patient presents with  . Abdominal Pain    left, vomiting, diarrhea, dx UC [facility]    HPI Patient was recently seen in the emergency department for intermittent blood in his stools over the past 3 years.  The thought at that time was that he may have ulcerative colitis and he was referred to gastroenterology.  He returned to the emergency department tonight for increasing left-sided abdominal pain.  He reports nausea without vomiting.  He denies diarrhea report some ongoing loose and occasionally bloody stools.  He also reports some mucus in his stools.  He reports this left-sided abdominal pain is constant.  Nothing worsens or improves his symptoms.  No urinary complaints.  His pain is mild to moderate in severity.  He is not scheduled his follow-up appointment with the gastroenterology service he was referred to yet.   Past Medical History  Diagnosis Date  . Ulcerative colitis    History reviewed. No pertinent past surgical history. History reviewed. No pertinent family history. History  Substance Use Topics  . Smoking status: Former Games developer  . Smokeless tobacco: Not on file  . Alcohol Use: Yes     Comment: social    Review of Systems  All other systems reviewed and are negative.     Allergies  Review of patient's allergies indicates no known allergies.  Home Medications   Prior to Admission medications   Medication Sig Start Date End Date Taking? Authorizing Provider  aspirin-acetaminophen-caffeine (EXCEDRIN MIGRAINE) [phone] MG per tablet Take 2 tablets by mouth every 6 (six) hours as needed for headache.   Yes Historical Provider, MD  ciprofloxacin (CIPRO) 500 MG tablet Take 1 tablet (500 mg total) by mouth 2 (two) times daily. 11/14/14  Yes Renne Crigler, PA-C  ibuprofen (ADVIL,MOTRIN) 200 MG tablet Take 200 mg by  mouth every 6 (six) hours as needed for moderate pain.   Yes Historical Provider, MD  metroNIDAZOLE (FLAGYL) 500 MG tablet Take 1 tablet (500 mg total) by mouth 3 (three) times daily. 11/14/14  Yes Renne Crigler, PA-C  HYDROcodone-acetaminophen (NORCO/VICODIN) 5-325 MG per tablet Take 1 tablet by mouth every 4 (four) hours as needed for moderate pain. 11/17/14   Azalia Bilis, MD  HYDROcodone-ibuprofen (VICOPROFEN) 7.5-200 MG per tablet Take 1 tablet by mouth every 6 (six) hours as needed for moderate pain. 11/17/14   Azalia Bilis, MD  ondansetron (ZOFRAN) 4 MG tablet Take 1 tablet (4 mg total) by mouth every 6 (six) hours. Patient not taking: Reported on 11/17/2014 10/14/13   Kristen N Ward, DO   BP 129/83 mmHg  Pulse 79  Temp(Src) 97.7 F (36.5 C) (Oral)  Resp 18  Ht 6' (1.829 m)  Wt 185 lb (83.915 kg)  BMI 25.08 kg/m2  SpO2 100% Physical Exam  Constitutional: He is oriented to person, place, and time. He appears well-developed and well-nourished.  HENT:  Head: Normocephalic and atraumatic.  Eyes: EOM are normal.  Neck: Normal range of motion.  Cardiovascular: Normal rate, regular rhythm, normal heart sounds and intact distal pulses.   Pulmonary/Chest: Effort normal and breath sounds normal. No respiratory distress.  Abdominal: Soft. He exhibits no distension.  Mild left-sided abdominal tenderness with foul guarding or rebound  Musculoskeletal: Normal range of motion.  Neurological: He is alert and oriented to person, place,  and time.  Skin: Skin is warm and dry.  Psychiatric: He has a normal mood and affect. Judgment normal.  Nursing note and vitals reviewed.   ED Course  Procedures (including critical care time) Labs Review Labs Reviewed  COMPREHENSIVE METABOLIC PANEL - Abnormal; Notable for the following:    Glucose, Bld 109 (*)    All other components within normal limits  CBC WITH DIFFERENTIAL/PLATELET  LIPASE, BLOOD  URINALYSIS, ROUTINE W REFLEX MICROSCOPIC    Imaging  Review Ct Abdomen Pelvis W Contrast  11/17/2014   CLINICAL DATA:  Severe left abdominal pain with bloody stool and emesis.  EXAM: CT ABDOMEN AND PELVIS WITH CONTRAST  TECHNIQUE: Multidetector CT imaging of the abdomen and pelvis was performed using the standard protocol following bolus administration of intravenous contrast.  CONTRAST:  100mL OMNIPAQUE IOHEXOL 300 MG/ML  SOLN  COMPARISON:  04/21/2007  FINDINGS: There is mild fatty infiltration of the liver without focal lesion. There are normal appearances of the spleen, pancreas, adrenals and kidneys. Bowel is unremarkable in appearance. No acute inflammatory changes are evident in the abdomen or pelvis. There is no adenopathy. There is no ascites. There are no significant abnormalities in the lower chest. No acute musculoskeletal abnormalities are evident.  IMPRESSION: Mild hepatic fatty infiltration.   Electronically Signed   By: Ellery Plunkaniel R Mitchell M.D.   On: 11/17/2014 06:33  I personally reviewed the imaging tests through PACS system I reviewed available ER/hospitalization records through the EMR    EKG Interpretation None      MDM   Final diagnoses:  Abdominal pain, unspecified abdominal location    CT scan without acute pathology.  Ongoing referral to gastroenterology.  Patient has a number and will contact him for follow-up.  I will add a very short course of hydrocodone for breakthrough pain.    Azalia BilisKevin Dewaine Morocho, MD 11/17/14 [phone]

## 2014-12-03 ENCOUNTER — Emergency Department
Admit: 2014-12-03 | Disposition: A | Discharge status: Home / Self Care | Payer: Self-pay | Admitting: Emergency Medicine

## 2014-12-03 LAB — COMPREHENSIVE METABOLIC PANEL
AST: 21 U/L
Albumin: 5.1 g/dL — ABNORMAL HIGH
Alkaline Phosphatase: 54 U/L
Anion Gap: 7 (ref 7–16)
BUN: 15 mg/dL
Bilirubin,Total: 1.3 mg/dL — ABNORMAL HIGH
CALCIUM: 9.8 mg/dL
Chloride: 104 mmol/L
Co2: 29 mmol/L
Creatinine: 0.88 mg/dL
EGFR (African American): >60
EGFR (Non-African Amer.): >60
Glucose: 75 mg/dL
Potassium: 4 mmol/L
SGPT (ALT): 20 U/L
SODIUM: 140 mmol/L
TOTAL PROTEIN: 8 g/dL

## 2014-12-03 LAB — CBC WITH DIFFERENTIAL/PLATELET
Basophil #: 0 10*3/uL (ref 0.0–0.1)
Basophil %: 0.8 %
Eosinophil #: 0.3 10*3/uL (ref 0.0–0.7)
Eosinophil %: 4.5 %
HCT: 48.6 % (ref 40.0–52.0)
HGB: 16 g/dL (ref 13.0–18.0)
LYMPHS ABS: 2.3 10*3/uL (ref 1.0–3.6)
Lymphocyte %: 40.9 %
MCH: 26.2 pg (ref 26.0–34.0)
MCHC: 33 g/dL (ref 32.0–36.0)
MCV: 79 fL — AB (ref 80–100)
MONOS PCT: 6.3 %
Monocyte #: 0.3 x10 3/mm (ref 0.2–1.0)
Neutrophil #: 2.6 10*3/uL (ref 1.4–6.5)
Neutrophil %: 47.5 %
PLATELETS: 261 10*3/uL (ref 150–440)
RBC: 6.13 10*6/uL — ABNORMAL HIGH (ref 4.40–5.90)
RDW: 14.9 % — AB (ref 11.5–14.5)
WBC: 5.6 10*3/uL (ref 3.8–10.6)

## 2014-12-03 LAB — TROPONIN I: Troponin-I: <0.03 ng/mL

## 2014-12-03 LAB — LIPASE, BLOOD: Lipase: 30 U/L

## 2014-12-04 LAB — URINALYSIS, COMPLETE
BACTERIA: NONE SEEN
Bilirubin,UR: NEGATIVE
Blood: NEGATIVE
GLUCOSE, UR: NEGATIVE mg/dL (ref 0–75)
Ketone: NEGATIVE
LEUKOCYTE ESTERASE: NEGATIVE
Nitrite: NEGATIVE
Ph: 5 (ref 4.5–8.0)
Protein: NEGATIVE
RBC,UR: <1 /HPF (ref 0–5)
SPECIFIC GRAVITY: 1.019 (ref 1.003–1.030)
Squamous Epithelial: <1

## 2015-03-15 ENCOUNTER — Encounter (HOSPITAL_BASED_OUTPATIENT_CLINIC_OR_DEPARTMENT_OTHER): Payer: Self-pay | Admitting: *Deleted

## 2015-03-15 ENCOUNTER — Emergency Department (HOSPITAL_BASED_OUTPATIENT_CLINIC_OR_DEPARTMENT_OTHER)
Admission: EM | Admit: 2015-03-15 | Discharge: 2015-03-15 | Disposition: A | Discharge status: Home / Self Care | Payer: Self-pay | Attending: Emergency Medicine | Admitting: Emergency Medicine

## 2015-03-15 DIAGNOSIS — X58XXXA Exposure to other specified factors, initial encounter: Secondary | ICD-10-CM | POA: Insufficient documentation

## 2015-03-15 DIAGNOSIS — Z792 Long term (current) use of antibiotics: Secondary | ICD-10-CM | POA: Insufficient documentation

## 2015-03-15 DIAGNOSIS — M549 Dorsalgia, unspecified: Secondary | ICD-10-CM | POA: Insufficient documentation

## 2015-03-15 DIAGNOSIS — Y9389 Activity, other specified: Secondary | ICD-10-CM | POA: Insufficient documentation

## 2015-03-15 DIAGNOSIS — Y990 Civilian activity done for income or pay: Secondary | ICD-10-CM | POA: Insufficient documentation

## 2015-03-15 DIAGNOSIS — T675XXA Heat exhaustion, unspecified, initial encounter: Secondary | ICD-10-CM | POA: Insufficient documentation

## 2015-03-15 DIAGNOSIS — Z8719 Personal history of other diseases of the digestive system: Secondary | ICD-10-CM | POA: Insufficient documentation

## 2015-03-15 DIAGNOSIS — Z87891 Personal history of nicotine dependence: Secondary | ICD-10-CM | POA: Insufficient documentation

## 2015-03-15 DIAGNOSIS — Y9289 Other specified places as the place of occurrence of the external cause: Secondary | ICD-10-CM | POA: Insufficient documentation

## 2015-03-15 DIAGNOSIS — H539 Unspecified visual disturbance: Secondary | ICD-10-CM | POA: Insufficient documentation

## 2015-03-15 LAB — LIPASE, BLOOD: LIPASE: 17 U/L — AB (ref 22–51)

## 2015-03-15 LAB — CBC WITH DIFFERENTIAL/PLATELET
BASOS ABS: 0 10*3/uL (ref 0.0–0.1)
Basophils Relative: 1 % (ref 0–1)
EOS PCT: 5 % (ref 0–5)
Eosinophils Absolute: 0.2 10*3/uL (ref 0.0–0.7)
HEMATOCRIT: 46.9 % (ref 39.0–52.0)
Hemoglobin: 16.2 g/dL (ref 13.0–17.0)
LYMPHS PCT: 39 % (ref 12–46)
Lymphs Abs: 1.9 10*3/uL (ref 0.7–4.0)
MCH: 26.3 pg (ref 26.0–34.0)
MCHC: 34.5 g/dL (ref 30.0–36.0)
MCV: 76.1 fL — AB (ref 78.0–100.0)
MONO ABS: 0.5 10*3/uL (ref 0.1–1.0)
MONOS PCT: 11 % (ref 3–12)
Neutro Abs: 2.1 10*3/uL (ref 1.7–7.7)
Neutrophils Relative %: 44 % (ref 43–77)
Platelets: 271 10*3/uL (ref 150–400)
RBC: 6.16 MIL/uL — AB (ref 4.22–5.81)
RDW: 14.8 % (ref 11.5–15.5)
WBC: 4.8 10*3/uL (ref 4.0–10.5)

## 2015-03-15 LAB — COMPREHENSIVE METABOLIC PANEL
ALK PHOS: 55 U/L (ref 38–126)
ALT: 18 U/L (ref 17–63)
ANION GAP: 10 (ref 5–15)
AST: 24 U/L (ref 15–41)
Albumin: 4.6 g/dL (ref 3.5–5.0)
BILIRUBIN TOTAL: 2.5 mg/dL — AB (ref 0.3–1.2)
BUN: 19 mg/dL (ref 6–20)
CO2: 23 mmol/L (ref 22–32)
CREATININE: 1.19 mg/dL (ref 0.61–1.24)
Calcium: 10 mg/dL (ref 8.9–10.3)
Chloride: 104 mmol/L (ref 101–111)
GFR calc Af Amer: >60 mL/min (ref >60)
GFR calc non Af Amer: >60 mL/min (ref >60)
Glucose, Bld: 114 mg/dL — ABNORMAL HIGH (ref 65–99)
Potassium: 3.6 mmol/L (ref 3.5–5.1)
Sodium: 137 mmol/L (ref 135–145)
TOTAL PROTEIN: 7.7 g/dL (ref 6.5–8.1)

## 2015-03-15 LAB — URINALYSIS, ROUTINE W REFLEX MICROSCOPIC
GLUCOSE, UA: NEGATIVE mg/dL
Hgb urine dipstick: NEGATIVE
Ketones, ur: NEGATIVE mg/dL
Leukocytes, UA: NEGATIVE
NITRITE: NEGATIVE
Protein, ur: NEGATIVE mg/dL
SPECIFIC GRAVITY, URINE: 1.03 (ref 1.005–1.030)
UROBILINOGEN UA: 0.2 mg/dL (ref 0.0–1.0)
pH: 5.5 (ref 5.0–8.0)

## 2015-03-15 LAB — CK: Total CK: 252 U/L (ref 49–397)

## 2015-03-15 MED ORDER — SODIUM CHLORIDE 0.9 % IV BOLUS (SEPSIS)
1000.0000 mL | Freq: Once | INTRAVENOUS | Status: AC
  Administered 2015-03-15: 1000 mL via INTRAVENOUS

## 2015-03-15 MED ORDER — ONDANSETRON HCL 4 MG/2ML IJ SOLN
4.0000 mg | Freq: Once | INTRAMUSCULAR | Status: AC
  Administered 2015-03-15: 4 mg via INTRAVENOUS
  Filled 2015-03-15: qty 2

## 2015-03-15 MED ORDER — SODIUM CHLORIDE 0.9 % IV SOLN
INTRAVENOUS | Status: DC
  Administered 2015-03-15: 21:00:00 via INTRAVENOUS

## 2015-03-15 NOTE — ED Notes (Signed)
He works in [employer]. Yesterday he passed out at the job site in the heat. Tried to go to work this am but is weak, nausea and having abdominal pain.

## 2015-03-15 NOTE — Discharge Instructions (Signed)
Heat-Related Illness °Heat-related illnesses occur when the body is unable to properly cool itself. The body normally cools itself by sweating. However, under some conditions sweating is not enough. In these cases, a person's body temperature rises rapidly. Very high body temperatures may damage the brain or other vital organs. Some examples of heat-related illnesses include: °· Heat stroke. This occurs when the body is unable to regulate its temperature. The body's temperature rises rapidly, the sweating mechanism fails, and the body is unable to cool down. Body temperature may rise to 106° F (41° C) or higher within 10 to 15 minutes. Heat stroke can cause death or permanent disability if emergency treatment is not provided. °· Heat exhaustion. This is a milder form of heat-related illness that can develop after several days of exposure to high temperatures and not enough fluids. It is the body's response to an excessive loss of the water and salt contained in sweat. °· Heat cramps. These usually affect people who sweat a lot during heavy activity. This sweating drains the body's salt and moisture. The low salt level in the muscles causes painful cramps. Heat cramps may also be a symptom of heat exhaustion. Heat cramps usually occur in the abdomen, arms, or legs. Get medical attention for cramps if you have heart problems or are on a low-sodium diet. °Those that are at greatest risk for heat-related illnesses include:  °· The elderly. °· Infant and the very young. °· People with mental illness and chronic diseases. °· People who are overweight (obese). °· Young and healthy people can even succumb to heat if they participate in strenuous physical activities during hot weather. °CAUSES  °Several factors affect the body's ability to cool itself during extremely hot weather. When the humidity is high, sweat will not evaporate as quickly. This prevents the body from releasing heat quickly. Other factors that can affect  the body's ability to cool down include:  °· Age. °· Obesity. °· Fever. °· Dehydration. °· Heart disease. °· Mental illness. °· Poor circulation. °· Sunburn. °· Prescription drug use. °· Alcohol use. °SYMPTOMS  °Heat stroke: Warning signs of heat stroke vary, but may include: °· An extremely high body temperature (above 103°F orally). °· A fast, strong pulse. °· Dizziness. °· Confusion. °· Red, hot, and dry skin. °· No sweating. °· Throbbing headache. °· Feeling sick to your stomach (nauseous). °· Unconsciousness. °Heat exhaustion: Warning signs of heat exhaustion include: °· Heavy sweating. °· Tiredness. °· Headache. °· Paleness. °· Weakness. °· Feeling sick to your stomach (nauseous) or vomiting. °· Muscle cramps. °Heat cramps °· Muscle pains or spasms. °TREATMENT  °Heat stroke °· Get into a cool environment. An indoor place that is air-conditioned may be best. °· Take a cool shower or bath. Have someone around to make sure you are okay. °· Take your temperature. Make sure it is going down. °Heat exhaustion °· Drink plenty of fluids. Do not drink liquids that contain caffeine, alcohol, or large amounts of sugar. These cause you to lose more body fluid. Also, avoid very cold drinks. They can cause stomach cramps. °· Get into a cool environment. An indoor place that is air-conditioned may be best. °· Take a cool shower or bath. Have someone around to make sure you are okay. °· Put on lightweight clothing. °Heat cramps °· Stop whatever activity you were doing. Do not attempt to do that activity for at least 3 hours after the cramps have gone away. °· Get into a cool environment. An indoor   place that is air-conditioned may be best. HOME CARE INSTRUCTIONS  To protect your health when temperatures are extremely high, follow these tips:  During heavy exercise in a hot environment, drink two to four glasses (16-32 ounces) of cool fluids each hour. Do not wait until you are thirsty to drink. Warning: If your caregiver  limits the amount of fluid you drink or has you on water pills, ask how much you should drink while the weather is hot.  Do not drink liquids that contain caffeine, alcohol, or large amounts of sugar. These cause you to lose more body fluid.  Avoid very cold drinks. They can cause stomach cramps.  Wear appropriate clothing. Choose lightweight, light-colored, loose-fitting clothing.  If you must be outdoors, try to limit your outdoor activity to morning and evening hours. Try to rest often in shady areas.  If you are not used to working or exercising in a hot environment, start slowly and pick up the pace gradually.  Stay cool in an air-conditioned place if possible. If your home does not have air conditioning, go to the shopping mall or [employer].  Taking a cool shower or bath may help you cool off. SEEK MEDICAL CARE IF:   You see any of the symptoms listed above. You may be dealing with a life-threatening emergency.  Symptoms worsen or last longer than 1 hour.  Heat cramps do not get better in 1 hour. MAKE SURE YOU:   Understand these instructions.  Will watch your condition.  Will get help right away if you are not doing well or get worse. Document Released: 05/27/2008 Document Revised: 11/10/2011 Document Reviewed: 05/27/2008 [facility] Patient Information 2015 [employer] [place]. This information is not intended to replace advice given to you by your health care provider. Make sure you discuss any questions you have with your health care provider.  Work note provided to stay out of the heat for the next 2 days.  Labs without significant abnormalities other than he had an isolated bilirubin elevation would recommend having that rechecked in 2 weeks.

## 2015-03-15 NOTE — ED Notes (Addendum)
MD at bedside. 

## 2015-03-15 NOTE — ED Provider Notes (Signed)
CSN: [id number]     Arrival date & time 03/15/15  1823 History  This chart was scribed for Gregory Mulders, MD by Elon Spanner, ED Scribe. This patient was seen in room MH09/MH09 and the patient's care was started at 8:49 PM   Chief Complaint  Patient presents with  . Fatigue   The history is provided by the patient. No language interpreter was used.   HPI Comments: Gregory Woodward is a 29 y.o. male who presents to the Emergency Department complaining of fatigue onset > 24 hours ago.  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  These complaints have resolved but he has felt lingering fatigue worsened with exertion and heat.  Since this episode, he has urinated dark urine 1-2 times in the last 24 hours.  The patient reports a history of ulcerative colitis.  He denies other injuries relating to the fall.   Past Medical History  Diagnosis Date  . Ulcerative colitis    History reviewed. No pertinent past surgical history. No family history on file. History  Substance Use Topics  . Smoking status: Former Games developer  . Smokeless tobacco: Not on file  . Alcohol Use: Yes     Comment: social    Review of Systems  Constitutional: Positive for activity change. Negative for fever and chills.  HENT: Negative for rhinorrhea and sore throat.   Eyes: Positive for visual disturbance.  Respiratory: Negative for cough and shortness of breath.   Cardiovascular: Negative for chest pain and leg swelling.  Gastrointestinal: Positive for abdominal pain (normal to baseline (hx of ulcerative colitis)). Negative for nausea, vomiting, diarrhea and blood in stool.  Genitourinary: Negative for dysuria.       Decreased urine; urine dark  Musculoskeletal: Positive for back pain. Negative for neck pain.  Skin: Negative for rash.  Neurological: Positive for headaches.  Hematological: Does not bruise/bleed  easily.  Psychiatric/Behavioral: Negative for confusion.      Allergies  Review of patient's allergies indicates no known allergies.  Home Medications   Prior to Admission medications   Medication Sig Start Date End Date Taking? Authorizing Provider  aspirin-acetaminophen-caffeine (EXCEDRIN MIGRAINE) [phone] MG per tablet Take 2 tablets by mouth every 6 (six) hours as needed for headache.    Historical Provider, MD  ciprofloxacin (CIPRO) 500 MG tablet Take 1 tablet (500 mg total) by mouth 2 (two) times daily. 11/14/14   Renne Crigler, PA-C  HYDROcodone-acetaminophen (NORCO/VICODIN) 5-325 MG per tablet Take 1 tablet by mouth every 4 (four) hours as needed for moderate pain. 11/17/14   Azalia Bilis, MD  HYDROcodone-ibuprofen (VICOPROFEN) 7.5-200 MG per tablet Take 1 tablet by mouth every 6 (six) hours as needed for moderate pain. 11/17/14   Azalia Bilis, MD  ibuprofen (ADVIL,MOTRIN) 200 MG tablet Take 200 mg by mouth every 6 (six) hours as needed for moderate pain.    Historical Provider, MD  metroNIDAZOLE (FLAGYL) 500 MG tablet Take 1 tablet (500 mg total) by mouth 3 (three) times daily. 11/14/14   Renne Crigler, PA-C  ondansetron (ZOFRAN) 4 MG tablet Take 1 tablet (4 mg total) by mouth every 6 (six) hours. Patient not taking: Reported on 11/17/2014 10/14/13   Kristen N Ward, DO   BP 97/56 mmHg  Pulse 94  Temp(Src) 98.6 F (37 C) (Oral)  Resp 16  Ht 6' (1.829 m)  Wt 175 lb (79.379 kg)  BMI 23.73 kg/m2  SpO2  100% Physical Exam  Constitutional: He is oriented to person, place, and time. He appears well-developed and well-nourished. No distress.  HENT:  Head: Normocephalic and atraumatic.  Mouth/Throat: Oropharynx is clear and moist.  Eyes: Conjunctivae and EOM are normal.  Sclera clear.  Pupils normal.  Eyes tracking normal.   Neck: Neck supple. No tracheal deviation present.  Cardiovascular: Normal rate, regular rhythm and normal heart sounds.   Pulmonary/Chest: Effort normal and  breath sounds normal. No respiratory distress.  Lungs CTA bilaterally.   Abdominal: Bowel sounds are normal. There is no tenderness.  Musculoskeletal: Normal range of motion. He exhibits no edema.  Neurological: He is alert and oriented to person, place, and time. No cranial nerve deficit. He exhibits normal muscle tone. Coordination normal.  Skin: Skin is warm and dry.  Psychiatric: He has a normal mood and affect. His behavior is normal.  Nursing note and vitals reviewed.   ED Course  Procedures (including critical care time)  DIAGNOSTIC STUDIES: Oxygen Saturation is 100% on RA, normal by my interpretation.    COORDINATION OF CARE:  8:56 PM Discussed treatment plan with patient at bedside.  Patient acknowledges and agrees with plan.    Labs Review Labs Reviewed  COMPREHENSIVE METABOLIC PANEL - Abnormal; Notable for the following:    Glucose, Bld 114 (*)    Total Bilirubin 2.5 (*)    All other components within normal limits  URINALYSIS, ROUTINE W REFLEX MICROSCOPIC (NOT AT [facility]) - Abnormal; Notable for the following:    Color, Urine AMBER (*)    Bilirubin Urine SMALL (*)    All other components within normal limits  LIPASE, BLOOD - Abnormal; Notable for the following:    Lipase 17 (*)    All other components within normal limits  CBC WITH DIFFERENTIAL/PLATELET - Abnormal; Notable for the following:    RBC 6.16 (*)    MCV 76.1 (*)    All other components within normal limits  CK   Results for orders placed or performed during the hospital encounter of 03/15/15  Comprehensive metabolic panel  Result Value Ref Range   Sodium 137 135 - 145 mmol/L   Potassium 3.6 3.5 - 5.1 mmol/L   Chloride 104 101 - 111 mmol/L   CO2 23 22 - 32 mmol/L   Glucose, Bld 114 (H) 65 - 99 mg/dL   BUN 19 6 - 20 mg/dL   Creatinine, Ser 1.611.19 0.61 - 1.24 mg/dL   Calcium 09.610.0 8.9 - 04.510.3 mg/dL   Total Protein 7.7 6.5 - 8.1 g/dL   Albumin 4.6 3.5 - 5.0 g/dL   AST 24 15 - 41 U/L   ALT 18 17 - 63  U/L   Alkaline Phosphatase 55 38 - 126 U/L   Total Bilirubin 2.5 (H) 0.3 - 1.2 mg/dL   GFR calc non Af Amer >60 >60 mL/min   GFR calc Af Amer >60 >60 mL/min   Anion gap 10 5 - 15  Urinalysis, Routine w reflex microscopic (not at [facility])  Result Value Ref Range   Color, Urine AMBER (A) YELLOW   APPearance CLEAR CLEAR   Specific Gravity, Urine 1.030 1.005 - 1.030   pH 5.5 5.0 - 8.0   Glucose, UA NEGATIVE NEGATIVE mg/dL   Hgb urine dipstick NEGATIVE NEGATIVE   Bilirubin Urine SMALL (A) NEGATIVE   Ketones, ur NEGATIVE NEGATIVE mg/dL   Protein, ur NEGATIVE NEGATIVE mg/dL   Urobilinogen, UA 0.2 0.0 - 1.0 mg/dL   Nitrite NEGATIVE NEGATIVE  Leukocytes, UA NEGATIVE NEGATIVE  Lipase, blood  Result Value Ref Range   Lipase 17 (L) 22 - 51 U/L  CBC with Differential/Platelet  Result Value Ref Range   WBC 4.8 4.0 - 10.5 K/uL   RBC 6.16 (H) 4.22 - 5.81 MIL/uL   Hemoglobin 16.2 13.0 - 17.0 g/dL   HCT 16.1 09.6 - 04.5 %   MCV 76.1 (L) 78.0 - 100.0 fL   MCH 26.3 26.0 - 34.0 pg   MCHC 34.5 30.0 - 36.0 g/dL   RDW 40.9 81.1 - 91.4 %   Platelets 271 150 - 400 K/uL   Neutrophils Relative % 44 43 - 77 %   Neutro Abs 2.1 1.7 - 7.7 K/uL   Lymphocytes Relative 39 12 - 46 %   Lymphs Abs 1.9 0.7 - 4.0 K/uL   Monocytes Relative 11 3 - 12 %   Monocytes Absolute 0.5 0.1 - 1.0 K/uL   Eosinophils Relative 5 0 - 5 %   Eosinophils Absolute 0.2 0.0 - 0.7 K/uL   Basophils Relative 1 0 - 1 %   Basophils Absolute 0.0 0.0 - 0.1 K/uL  CK  Result Value Ref Range   Total CK 252 49 - 397 U/L     Imaging Review No results found.   EKG Interpretation None      MDM   Final diagnoses:  Heat exhaustion, initial encounter    The patient without any evidence of hematuria or myoglobinuria or elevated  CK. Symptoms seem to be consistent with heat exhaustion however patient currently appears to be well-hydrated. We'll give the time off from work to stay out of the heat. Patient nontoxic no acute distress.  Labs without significant abnormalities. Other than an elevated bilirubin will have patient have that checked again in the future. Otherwise liver function test are normal.  I personally performed the services described in this documentation, which was scribed in my presence. The recorded information has been reviewed and is accurate.     Gregory Mulders, MD 03/15/15 [phone]

## 2015-03-19 ENCOUNTER — Emergency Department (HOSPITAL_COMMUNITY)
Admission: EM | Admit: 2015-03-19 | Discharge: 2015-03-20 | Disposition: A | Discharge status: Home / Self Care | Payer: Self-pay | Attending: Emergency Medicine | Admitting: Emergency Medicine

## 2015-03-19 ENCOUNTER — Encounter (HOSPITAL_COMMUNITY): Payer: Self-pay

## 2015-03-19 DIAGNOSIS — K5900 Constipation, unspecified: Secondary | ICD-10-CM | POA: Insufficient documentation

## 2015-03-19 DIAGNOSIS — Z87891 Personal history of nicotine dependence: Secondary | ICD-10-CM | POA: Insufficient documentation

## 2015-03-19 MED ORDER — MAGNESIUM CITRATE PO SOLN
1.0000 | Freq: Once | ORAL | Status: AC
  Administered 2015-03-20: 1 via ORAL
  Filled 2015-03-19: qty 296

## 2015-03-19 MED ORDER — DICYCLOMINE HCL 20 MG PO TABS
20.0000 mg | ORAL_TABLET | Freq: Once | ORAL | Status: AC
  Administered 2015-03-20: 20 mg via ORAL
  Filled 2015-03-19: qty 1

## 2015-03-19 NOTE — ED Notes (Signed)
[facility] same and hasn't been able to have a bowel movement since.

## 2015-03-19 NOTE — ED Provider Notes (Addendum)
CSN: [id number]     Arrival date & time 03/19/15  2224 History  This chart was scribed for Marisa Severinlga Aliou Mealey, MD by Placido SouLogan Joldersma, ED scribe. This patient was seen in room WA10/WA10 and the patient's care was started at 11:44 PM.     Chief Complaint  Patient presents with  . Constipation   The history is provided by the patient. No language interpreter was used.    HPI Comments: Noah CharonLuis Kallal is a 29 y.o. male who presents to the Emergency Department complaining of intermittent, moderate, abd pain with worsening symptoms beginning today. Pt notes associated constipation with onset 4 days ago and very few instances of urination. 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 Pt notes recently removing fried foods from his diet due to his recent ulcerative colitis diagnosis but denies taking any medications for these symptoms or any history of surgery for this diagnosis. Pt denies having gone to see the recommend gastroenterologist or PCP due to not having any current medical coverage. He denies fever or chills.   Past Medical History  Diagnosis Date  . Ulcerative colitis    History reviewed. No pertinent past surgical history. History reviewed. No pertinent family history. History  Substance Use Topics  . Smoking status: Former Games developermoker  . Smokeless tobacco: Not on file  . Alcohol Use: Yes     Comment: social    Review of Systems  Constitutional: Negative for fever and chills.  Gastrointestinal: Positive for abdominal pain and constipation. Negative for diarrhea.  Genitourinary: Positive for decreased urine volume. Negative for urgency.  All other systems reviewed and are negative.  Allergies  Onion  Home Medications   Prior to Admission medications    Medication Sig Start Date End Date Taking? Authorizing Provider  aspirin-acetaminophen-caffeine (EXCEDRIN MIGRAINE) [phone] MG per tablet Take 2 tablets by mouth every 6 (six) hours as needed for headache.    Historical Provider, MD  ciprofloxacin (CIPRO) 500 MG tablet Take 1 tablet (500 mg total) by mouth 2 (two) times daily. Patient not taking: Reported on 03/19/2015 11/14/14   Renne CriglerJoshua Geiple, PA-C  HYDROcodone-acetaminophen (NORCO/VICODIN) 5-325 MG per tablet Take 1 tablet by mouth every 4 (four) hours as needed for moderate pain. Patient not taking: Reported on 03/19/2015 11/17/14   Azalia BilisKevin Campos, MD  HYDROcodone-ibuprofen (VICOPROFEN) 7.5-200 MG per tablet Take 1 tablet by mouth every 6 (six) hours as needed for moderate pain. Patient not taking: Reported on 03/19/2015 11/17/14   Azalia BilisKevin Campos, MD  ibuprofen (ADVIL,MOTRIN) 200 MG tablet Take 200 mg by mouth every 6 (six) hours as needed for moderate pain.    Historical Provider, MD  metroNIDAZOLE (FLAGYL) 500 MG tablet Take 1 tablet (500 mg total) by mouth 3 (three) times daily. Patient not taking: Reported on 03/19/2015 11/14/14   Renne CriglerJoshua Geiple, PA-C  ondansetron (ZOFRAN) 4 MG tablet Take 1 tablet (4 mg total) by mouth every 6 (six) hours. Patient not taking: Reported on 11/17/2014 10/14/13   Kristen N Ward, DO   BP 118/74 mmHg  Pulse 89  Temp(Src) 98.3 F (36.8 C) (Oral)  Resp 18  SpO2 99% Physical Exam  ED Course  Procedures  DIAGNOSTIC STUDIES: Oxygen Saturation is 99% on RA, normal by my interpretation.  COORDINATION OF CARE: 11:54 PM Discussed treatment plan with pt at bedside and pt agreed to plan.  Labs Review Labs Reviewed  URINALYSIS, ROUTINE W REFLEX MICROSCOPIC (NOT AT [facility])    Imaging Review Dg Abd 1 View  03/20/2015   CLINICAL DATA:  Severe abdominal pain for 3 days, intermittent constipation and diarrhea. History of ulcerative colitis, untreated.  EXAM: ABDOMEN - 1 VIEW  COMPARISON:  CT abdomen pelvis December 04, 2014   FINDINGS: The bowel gas pattern is normal. Mild amount of retained large bowel stool. No radio-opaque calculi or other significant radiographic abnormality are seen.  IMPRESSION: Mild amount of retained large bowel stool without bowel obstruction.   Electronically Signed   By: Awilda Metro M.D.   On: 03/20/2015 01:08     EKG Interpretation None      MDM   Final diagnoses:  Constipation, unspecified constipation type   I personally performed the services described in this documentation, which was scribed in my presence. The recorded information has been reviewed and is accurate.   29 year old male with intermittent abdominal pain for some time, worse over the weekend, seen at [facility] on Friday with labs.  Workup for dehydration.  Patient reports that he was diagnosed with ulcerative colitis.  Reviewing prior ED visits.  He had a visit where he complained of blood in stool and it was thought that he had colitis and was referred to GI.  He has never had an endoscopy or a tissue proven diagnosis of ulcer colitis.  He reports that he has self treated with dietary changes.  I suspect more likely he has IBS instead of an IBD.  Patient's main complaint is constipation.  Based on his diet, which is mainly fast food rice and beans, suspect constipation secondary to poor diet, low fiber, and isn't adequate water intake.  Plan for UA, mag citrate, KUB.    Marisa Severin, MD 03/20/15 1610  Marisa Severin, MD 04/03/15 [phone]

## 2015-03-20 ENCOUNTER — Emergency Department (HOSPITAL_COMMUNITY): Payer: Self-pay

## 2015-03-20 LAB — URINALYSIS, ROUTINE W REFLEX MICROSCOPIC
BILIRUBIN URINE: NEGATIVE
Glucose, UA: NEGATIVE mg/dL
HGB URINE DIPSTICK: NEGATIVE
Ketones, ur: NEGATIVE mg/dL
LEUKOCYTES UA: NEGATIVE
Nitrite: NEGATIVE
PH: 5.5 (ref 5.0–8.0)
Protein, ur: NEGATIVE mg/dL
SPECIFIC GRAVITY, URINE: 1.027 (ref 1.005–1.030)
UROBILINOGEN UA: 0.2 mg/dL (ref 0.0–1.0)

## 2015-03-20 MED ORDER — DOCUSATE SODIUM 100 MG PO CAPS: 100.0000 mg | ORAL_CAPSULE | Freq: Two times a day (BID) | ORAL | Status: DC

## 2015-03-20 MED ORDER — DICYCLOMINE HCL 20 MG PO TABS: 20.0000 mg | ORAL_TABLET | Freq: Four times a day (QID) | ORAL | Status: DC | PRN

## 2015-03-20 MED ORDER — POLYETHYLENE GLYCOL 3350 17 G PO PACK: PACK | ORAL | Status: DC

## 2015-03-20 NOTE — Discharge Instructions (Signed)
Constipation °Constipation is when a person has fewer than three bowel movements a week, has difficulty having a bowel movement, or has stools that are dry, hard, or larger than normal. As people grow older, constipation is more common. If you try to fix constipation with medicines that make you have a bowel movement (laxatives), the problem may get worse. Long-term laxative use may cause the muscles of the colon to become weak. A low-fiber diet, not taking in enough fluids, and taking certain medicines may make constipation worse.  °CAUSES  °· Certain medicines, such as antidepressants, pain medicine, iron supplements, antacids, and water pills.   °· Certain diseases, such as diabetes, irritable bowel syndrome (IBS), thyroid disease, or depression.   °· Not drinking enough water.   °· Not eating enough fiber-rich foods.   °· Stress or travel.   °· Lack of physical activity or exercise.   °· Ignoring the urge to have a bowel movement.   °· Using laxatives too much.   °SIGNS AND SYMPTOMS  °· Having fewer than three bowel movements a week.   °· Straining to have a bowel movement.   °· Having stools that are hard, dry, or larger than normal.   °· Feeling full or bloated.   °· Pain in the lower abdomen.   °· Not feeling relief after having a bowel movement.   °DIAGNOSIS  °Your health care provider will take a medical history and perform a physical exam. Further testing may be done for severe constipation. Some tests may include: °· A barium enema X-ray to examine your rectum, colon, and, sometimes, your small intestine.   °· A sigmoidoscopy to examine your lower colon.   °· A colonoscopy to examine your entire colon. °TREATMENT  °Treatment will depend on the severity of your constipation and what is causing it. Some dietary treatments include drinking more fluids and eating more fiber-rich foods. Lifestyle treatments may include regular exercise. If these diet and lifestyle recommendations do not help, your health care  provider may recommend taking over-the-counter laxative medicines to help you have bowel movements. Prescription medicines may be prescribed if over-the-counter medicines do not work.  °HOME CARE INSTRUCTIONS  °· Eat foods that have a lot of fiber, such as fruits, vegetables, whole grains, and beans. °· Limit foods high in fat and processed sugars, such as french fries, hamburgers, cookies, candies, and soda.   °· A fiber supplement may be added to your diet if you cannot get enough fiber from foods.   °· Drink enough fluids to keep your urine clear or pale yellow.   °· Exercise regularly or as directed by your health care provider.   °· Go to the restroom when you have the urge to go. Do not hold it.   °· Only take over-the-counter or prescription medicines as directed by your health care provider. Do not take other medicines for constipation without talking to your health care provider first.   °SEEK IMMEDIATE MEDICAL CARE IF:  °· You have bright red blood in your stool.   °· Your constipation lasts for more than 4 days or gets worse.   °· You have abdominal or rectal pain.   °· You have thin, pencil-like stools.   °· You have unexplained weight loss. °MAKE SURE YOU:  °· Understand these instructions. °· Will watch your condition. °· Will get help right away if you are not doing well or get worse. °Document Released: 05/16/2004 Document Revised: 08/23/2013 Document Reviewed: 05/30/2013 °ExitCare® Patient Information ©2015 [employer]. This information is not intended to replace advice given to you by your health care provider. Make sure you discuss any questions   you have with your health care provider.  Diet and Irritable Bowel Syndrome  No cure has been found for irritable bowel syndrome (IBS). Many options are available to treat the symptoms. Your caregiver will give you the best treatments available for your symptoms. He or she will also encourage you to manage stress and to make changes to your diet. You  need to work with your caregiver and Registered Dietician to find the best combination of medicine, diet, counseling, and support to control your symptoms. The following are some diet suggestions. FOODS THAT MAKE IBS WORSE  Fatty foods, such as [place] fries.  Milk products, such as cheese or ice cream.  Chocolate.  Alcohol.  Caffeine (found in coffee and some sodas).  Carbonated drinks, such as soda. If certain foods cause symptoms, you should eat less of them or stop eating them. FOOD JOURNAL   Keep a journal of the foods that seem to cause distress. Write down:  What you are eating during the day and when.  What problems you are having after eating.  When the symptoms occur in relation to your meals.  What foods always make you feel badly.  Take your notes with you to your caregiver to see if you should stop eating certain foods. FOODS THAT MAKE IBS BETTER Fiber reduces IBS symptoms, especially constipation, because it makes stools soft, bulky, and easier to pass. Fiber is found in bran, bread, cereal, beans, fruit, and vegetables. Examples of foods with fiber include:  Apples.  Peaches.  Pears.  Berries.  Figs.  Broccoli, raw.  Cabbage.  Carrots.  Raw peas.  Kidney beans.  Lima beans.  Whole-grain bread.  Whole-grain cereal. Add foods with fiber to your diet a little at a time. This will let your body get used to them. Too much fiber at once might cause gas and swelling of your abdomen. This can trigger symptoms in a person with IBS. Caregivers usually recommend a diet with enough fiber to produce soft, painless bowel movements. High fiber diets may cause gas and bloating. However, these symptoms often go away within a few weeks, as your body adjusts. In many cases, dietary fiber may lessen IBS symptoms, particularly constipation. However, it may not help pain or diarrhea. High fiber diets keep the colon mildly enlarged (distended) with the added fiber.  This may help prevent spasms in the colon. Some forms of fiber also keep water in the stool, thereby preventing hard stools that are difficult to pass.  Besides telling you to eat more foods with fiber, your caregiver may also tell you to get more fiber by taking a fiber pill or drinking water mixed with a special high fiber powder. An example of this is a natural fiber laxative containing psyllium seed.  TIPS  Large meals can cause cramping and diarrhea in people with IBS. If this happens to you, try eating 4 or 5 small meals a day, or try eating less at each of your usual 3 meals. It may also help if your meals are low in fat and high in carbohydrates. Examples of carbohydrates are pasta, rice, whole-grain breads and cereals, fruits, and vegetables.  If dairy products cause your symptoms to flare up, you can try eating less of those foods. You might be able to handle yogurt better than other dairy products, because it contains bacteria that helps with digestion. Dairy products are an important source of calcium and other nutrients. If you need to avoid dairy products, be sure to  talk with a Registered Dietitian about getting these nutrients through other food sources.  Drink enough water and fluids to keep your urine clear or pale yellow. This is important, especially if you have diarrhea. FOR MORE INFORMATION  International Foundation for Functional Gastrointestinal Disorders: www.iffgd.org  National Digestive Diseases Information Clearinghouse: digestive.StageSync.siniddk.nih.gov Document Released: 11/08/2003 Document Revised: 11/10/2011 Document Reviewed: 11/18/2013 [facility] Patient Information 2015 [place], [place]. This information is not intended to replace advice given to you by your health care provider. Make sure you discuss any questions you have with your health care provider.

## 2015-03-20 NOTE — ED Notes (Signed)
Pt was able to have a large BM. MD notified.

## 2015-04-09 ENCOUNTER — Emergency Department (HOSPITAL_COMMUNITY)
Admission: EM | Admit: 2015-04-09 | Discharge: 2015-04-09 | Disposition: A | Discharge status: Home / Self Care | Payer: Self-pay | Attending: Emergency Medicine | Admitting: Emergency Medicine

## 2015-04-09 ENCOUNTER — Emergency Department (HOSPITAL_COMMUNITY): Payer: Self-pay

## 2015-04-09 ENCOUNTER — Encounter (HOSPITAL_COMMUNITY): Payer: Self-pay | Admitting: Emergency Medicine

## 2015-04-09 DIAGNOSIS — Z87891 Personal history of nicotine dependence: Secondary | ICD-10-CM | POA: Insufficient documentation

## 2015-04-09 DIAGNOSIS — R109 Unspecified abdominal pain: Secondary | ICD-10-CM

## 2015-04-09 DIAGNOSIS — Z79899 Other long term (current) drug therapy: Secondary | ICD-10-CM | POA: Insufficient documentation

## 2015-04-09 DIAGNOSIS — R1012 Left upper quadrant pain: Secondary | ICD-10-CM | POA: Insufficient documentation

## 2015-04-09 LAB — CBC
HCT: 44.5 % (ref 39.0–52.0)
HEMOGLOBIN: 15 g/dL (ref 13.0–17.0)
MCH: 26.2 pg (ref 26.0–34.0)
MCHC: 33.7 g/dL (ref 30.0–36.0)
MCV: 77.8 fL — AB (ref 78.0–100.0)
Platelets: 238 10*3/uL (ref 150–400)
RBC: 5.72 MIL/uL (ref 4.22–5.81)
RDW: 13.5 % (ref 11.5–15.5)
WBC: 3.7 10*3/uL — AB (ref 4.0–10.5)

## 2015-04-09 LAB — COMPREHENSIVE METABOLIC PANEL
ALK PHOS: 53 U/L (ref 38–126)
ALT: 18 U/L (ref 17–63)
AST: 22 U/L (ref 15–41)
Albumin: 4 g/dL (ref 3.5–5.0)
Anion gap: 7 (ref 5–15)
BILIRUBIN TOTAL: 1.5 mg/dL — AB (ref 0.3–1.2)
BUN: 8 mg/dL (ref 6–20)
CO2: 28 mmol/L (ref 22–32)
Calcium: 9.6 mg/dL (ref 8.9–10.3)
Chloride: 106 mmol/L (ref 101–111)
Creatinine, Ser: 1 mg/dL (ref 0.61–1.24)
GFR calc Af Amer: >60 mL/min (ref >60)
GFR calc non Af Amer: >60 mL/min (ref >60)
GLUCOSE: 94 mg/dL (ref 65–99)
POTASSIUM: 4.7 mmol/L (ref 3.5–5.1)
SODIUM: 141 mmol/L (ref 135–145)
TOTAL PROTEIN: 6.8 g/dL (ref 6.5–8.1)

## 2015-04-09 LAB — LIPASE, BLOOD: LIPASE: 28 U/L (ref 22–51)

## 2015-04-09 MED ORDER — DICYCLOMINE HCL 20 MG PO TABS: 20.0000 mg | ORAL_TABLET | Freq: Two times a day (BID) | ORAL | Status: DC

## 2015-04-09 MED ORDER — OXYCODONE-ACETAMINOPHEN 5-325 MG PO TABS
1.0000 | ORAL_TABLET | Freq: Once | ORAL | Status: AC
  Administered 2015-04-09: 1 via ORAL
  Filled 2015-04-09: qty 1

## 2015-04-09 MED ORDER — FAMOTIDINE 20 MG PO TABS: 20.0000 mg | ORAL_TABLET | Freq: Two times a day (BID) | ORAL | Status: DC

## 2015-04-09 NOTE — Discharge Instructions (Signed)
Take Pepcid as prescribed for possible gastritis. Bentyl for intestinal pain. They should drink plenty of fluids. I would increase or spicy foods. Follow-up with gastroenterologist. Return if worsening symptoms.    Abdominal Pain Many things can cause abdominal pain. Usually, abdominal pain is not caused by a disease and will improve without treatment. It can often be observed and treated at home. Your health care provider will do a physical exam and possibly order blood tests and X-rays to help determine the seriousness of your pain. However, in many cases, more time must pass before a clear cause of the pain can be found. Before that point, your health care provider may not know if you need more testing or further treatment. HOME CARE INSTRUCTIONS  Monitor your abdominal pain for any changes. The following actions may help to alleviate any discomfort you are experiencing:  Only take over-the-counter or prescription medicines as directed by your health care provider.  Do not take laxatives unless directed to do so by your health care provider.  Try a clear liquid diet (broth, tea, or water) as directed by your health care provider. Slowly move to a bland diet as tolerated. SEEK MEDICAL CARE IF:  You have unexplained abdominal pain.  You have abdominal pain associated with nausea or diarrhea.  You have pain when you urinate or have a bowel movement.  You experience abdominal pain that wakes you in the night.  You have abdominal pain that is worsened or improved by eating food.  You have abdominal pain that is worsened with eating fatty foods.  You have a fever. SEEK IMMEDIATE MEDICAL CARE IF:   Your pain does not go away within 2 hours.  You keep throwing up (vomiting).  Your pain is felt only in portions of the abdomen, such as the right side or the left lower portion of the abdomen.  You pass bloody or black tarry stools. MAKE SURE YOU:  Understand these instructions.    Will watch your condition.   Will get help right away if you are not doing well or get worse.  Document Released: 05/28/2005 Document Revised: 08/23/2013 Document Reviewed: 04/27/2013 [facility] Patient Information 2015 [place], [place]. This information is not intended to replace advice given to you by your health care provider. Make sure you discuss any questions you have with your health care provider.

## 2015-04-09 NOTE — ED Notes (Signed)
PA at bedside.

## 2015-04-09 NOTE — ED Provider Notes (Signed)
CSN: [id number]     Arrival date & time 04/09/15  1301 History   First MD Initiated Contact with Patient 04/09/15 1902     Chief Complaint  Patient presents with  . Rectal Bleeding  . Abdominal Pain     (Consider location/radiation/quality/duration/timing/severity/associated sxs/prior Treatment) HPI Gregory Woodward is a 29 y.o. male for no medical problems except for self diagnosed ulcerative colitis, presents to emergency department complaining of abdominal pain. Patient states he has had an on and off abdominal pain for a year. He has been to emergency department several times for the same. He states pain got worse since yesterday. He states he also had a bowel movement today which was bloody. He states he self diagnosed himself with ulcerative colitis from all of his symptoms and the reading about it on the Internet. He also said "all the doctors tell me that it could be and that I need to see gastroenterologist but I just cannot afford one right now. He denies any diarrhea or constipation prior to onset of symptoms. He denies any nausea or vomiting. He states pain is mainly in the left upper quadrant and does not radiate. He denies any urinary symptoms. No back pain. He has no blood in his emesis. There is no other complaints. No treatment prior to coming in.  Past Medical History  Diagnosis Date  . Ulcerative colitis    History reviewed. No pertinent past surgical history. History reviewed. No pertinent family history. History  Substance Use Topics  . Smoking status: Former Games developer  . Smokeless tobacco: Not on file  . Alcohol Use: Yes     Comment: social    Review of Systems  Constitutional: Negative for fever and chills.  Respiratory: Negative for cough, chest tightness and shortness of breath.   Cardiovascular: Negative for chest pain, palpitations and leg swelling.  Gastrointestinal: Positive for abdominal pain and blood in stool. Negative for nausea, vomiting, diarrhea, constipation,  abdominal distention and rectal pain.  Genitourinary: Negative for dysuria, urgency, frequency and hematuria.  Musculoskeletal: Negative for myalgias, arthralgias, neck pain and neck stiffness.  Skin: Negative for rash.  Allergic/Immunologic: Negative for immunocompromised state.  Neurological: Negative for dizziness, weakness, light-headedness, numbness and headaches.  All other systems reviewed and are negative.     Allergies  Onion  Home Medications   Prior to Admission medications   Medication Sig Start Date End Date Taking? Authorizing Provider  dicyclomine (BENTYL) 20 MG tablet Take 1 tablet (20 mg total) by mouth every 6 (six) hours as needed for spasms (for abdominal cramping). 03/20/15   Marisa Severin, MD  docusate sodium (COLACE) 100 MG capsule Take 1 capsule (100 mg total) by mouth every 12 (twelve) hours. 03/20/15   Marisa Severin, MD  polyethylene glycol [facility] / Ethelene Hal) packet Take one capful in 8 oz of fluid of your choice twice a day until having soft stools, then back down to once a day.  If not having soft stools with twice a day, increased to three times a day 03/20/15   Marisa Severin, MD   BP 103/47 mmHg  Pulse 63  Temp(Src) 98 F (36.7 C)  Resp 16  Ht 6' (1.829 m)  Wt 180 lb (81.647 kg)  BMI 24.41 kg/m2  SpO2 100% Physical Exam  Constitutional: He appears well-developed and well-nourished. No distress.  HENT:  Head: Normocephalic and atraumatic.  Eyes: Conjunctivae are normal.  Neck: Neck supple.  Cardiovascular: Normal rate, regular rhythm and normal heart sounds.   Pulmonary/Chest:  Effort normal. No respiratory distress. He has no wheezes. He has no rales.  Abdominal: Soft. Bowel sounds are normal. He exhibits no distension. There is no tenderness. There is no rebound.  Left upper quadrant tenderness, no guarding  Musculoskeletal: He exhibits no edema.  Neurological: He is alert.  Skin: Skin is warm and dry.  Nursing note and vitals reviewed.   ED Course   Procedures (including critical care time) Labs Review Labs Reviewed  COMPREHENSIVE METABOLIC PANEL - Abnormal; Notable for the following:    Total Bilirubin 1.5 (*)    All other components within normal limits  CBC - Abnormal; Notable for the following:    WBC 3.7 (*)    MCV 77.8 (*)    All other components within normal limits  LIPASE, BLOOD    Imaging Review Dg Abd 2 Views  04/09/2015   CLINICAL DATA:  29 year old male with left upper quadrant abdominal pain and rectal bleeding. Medical history includes ulcerative colitis.  EXAM: ABDOMEN - 2 VIEW  COMPARISON:  Prior abdominal radiograph 03/20/2015  FINDINGS: The bowel gas pattern is normal. There is no evidence of free air. No radio-opaque calculi or other significant radiographic abnormality is seen.  IMPRESSION: Negative.   Electronically Signed   By: Malachy Moan M.D.   On: 04/09/2015 19:58     EKG Interpretation None      MDM   Final diagnoses:  Abdominal pain     patient emergency department with left upper quadrant pain, worsened yesterday, onset a year ago. He has been to emergency department several times for the same. There has not been a diagnosis made. He had a negative CT scan 5 months ago. He was referred to GI specialist multiple times but he has not gone. At this time his abdomen is benign, there is no guarding. Labs and x-ray pending.   Patient's lab work is unremarkable. His white count is 3.7. He has normal LFTs and lipase. His vital signs are normal. He is abdomen continues to be benign, do not think he has surgical abdomen. His abdominal x-rays negative. I this time I do not think he needs a CT scan. As far as the rectal bleeding, he states he has had it in the past, most likely hemorrhoidal versus colitis, hemoglobin is normal today. I again emphasized that he needs to follow with gastroenterologist for further diagnosis and treatment. Patient was understanding. Will start on Pepcid, bentyl,  follow-up.  Filed Vitals:   04/09/15 1930 04/09/15 2024 04/09/15 2025 04/09/15 2100  BP: 117/66 103/47  121/68  Pulse: 55  63 50  Temp:      Resp:      Height:      Weight:      SpO2: 100%  100% 100%     Jaynie Crumble, PA-C 04/10/15 0126  Mancel Bale, MD 04/11/15 1338

## 2015-04-09 NOTE — ED Notes (Signed)
Pt sts red colored rectal bleeding x 2 days with LUQ abd pain; pt sts hx of ulcerative colitis

## 2016-06-08 ENCOUNTER — Encounter (HOSPITAL_BASED_OUTPATIENT_CLINIC_OR_DEPARTMENT_OTHER): Payer: Self-pay | Admitting: Emergency Medicine

## 2016-06-08 ENCOUNTER — Emergency Department (HOSPITAL_BASED_OUTPATIENT_CLINIC_OR_DEPARTMENT_OTHER)
Admission: EM | Admit: 2016-06-08 | Discharge: 2016-06-08 | Disposition: A | Discharge status: Home / Self Care | Payer: Self-pay | Attending: Emergency Medicine | Admitting: Emergency Medicine

## 2016-06-08 DIAGNOSIS — K51911 Ulcerative colitis, unspecified with rectal bleeding: Secondary | ICD-10-CM | POA: Insufficient documentation

## 2016-06-08 DIAGNOSIS — Z87891 Personal history of nicotine dependence: Secondary | ICD-10-CM | POA: Insufficient documentation

## 2016-06-08 LAB — CBC WITH DIFFERENTIAL/PLATELET
Basophils Absolute: 0 10*3/uL (ref 0.0–0.1)
Basophils Relative: 0 %
EOS ABS: 0.1 10*3/uL (ref 0.0–0.7)
Eosinophils Relative: 3 %
HCT: 45.1 % (ref 39.0–52.0)
Hemoglobin: 15.4 g/dL (ref 13.0–17.0)
Lymphocytes Relative: 37 %
Lymphs Abs: 1.2 10*3/uL (ref 0.7–4.0)
MCH: 26.4 pg (ref 26.0–34.0)
MCHC: 34.1 g/dL (ref 30.0–36.0)
MCV: 77.4 fL — ABNORMAL LOW (ref 78.0–100.0)
MONOS PCT: 7 %
Monocytes Absolute: 0.2 10*3/uL (ref 0.1–1.0)
NEUTROS ABS: 1.7 10*3/uL (ref 1.7–7.7)
Neutrophils Relative %: 53 %
PLATELETS: 212 10*3/uL (ref 150–400)
RBC: 5.83 MIL/uL — ABNORMAL HIGH (ref 4.22–5.81)
RDW: 13.3 % (ref 11.5–15.5)
WBC: 3.2 10*3/uL — ABNORMAL LOW (ref 4.0–10.5)

## 2016-06-08 LAB — COMPREHENSIVE METABOLIC PANEL
ALBUMIN: 4.3 g/dL (ref 3.5–5.0)
ALT: 17 U/L (ref 17–63)
ANION GAP: 4 — AB (ref 5–15)
AST: 18 U/L (ref 15–41)
Alkaline Phosphatase: 46 U/L (ref 38–126)
BUN: 11 mg/dL (ref 6–20)
CO2: 28 mmol/L (ref 22–32)
Calcium: 9.2 mg/dL (ref 8.9–10.3)
Chloride: 107 mmol/L (ref 101–111)
Creatinine, Ser: 0.8 mg/dL (ref 0.61–1.24)
GFR calc Af Amer: >60 mL/min (ref >60)
GFR calc non Af Amer: >60 mL/min (ref >60)
GLUCOSE: 99 mg/dL (ref 65–99)
POTASSIUM: 4.2 mmol/L (ref 3.5–5.1)
Sodium: 139 mmol/L (ref 135–145)
Total Bilirubin: 1 mg/dL (ref 0.3–1.2)
Total Protein: 7.2 g/dL (ref 6.5–8.1)

## 2016-06-08 LAB — I-STAT CG4 LACTIC ACID, ED: Lactic Acid, Venous: 0.48 mmol/L — ABNORMAL LOW (ref 0.5–1.9)

## 2016-06-08 LAB — LIPASE, BLOOD: LIPASE: 28 U/L (ref 11–51)

## 2016-06-08 MED ORDER — HYDROMORPHONE HCL 1 MG/ML IJ SOLN
1.0000 mg | Freq: Once | INTRAMUSCULAR | Status: AC
  Administered 2016-06-08: 1 mg via INTRAVENOUS
  Filled 2016-06-08: qty 1

## 2016-06-08 MED ORDER — ONDANSETRON HCL 4 MG PO TABS: 4.0000 mg | ORAL_TABLET | Freq: Four times a day (QID) | ORAL | 0 refills | Status: DC

## 2016-06-08 MED ORDER — PREDNISONE 10 MG PO TABS
60.0000 mg | ORAL_TABLET | Freq: Once | ORAL | Status: AC
  Administered 2016-06-08: 60 mg via ORAL
  Filled 2016-06-08: qty 1

## 2016-06-08 MED ORDER — HYDROCODONE-ACETAMINOPHEN 5-325 MG PO TABS: 1.0000 | ORAL_TABLET | Freq: Four times a day (QID) | ORAL | 0 refills | Status: DC | PRN

## 2016-06-08 MED ORDER — PREDNISONE 20 MG PO TABS: 40.0000 mg | ORAL_TABLET | Freq: Every day | ORAL | 0 refills | Status: DC

## 2016-06-08 MED ORDER — ONDANSETRON HCL 4 MG/2ML IJ SOLN
4.0000 mg | Freq: Once | INTRAMUSCULAR | Status: AC
  Administered 2016-06-08: 4 mg via INTRAVENOUS
  Filled 2016-06-08: qty 2

## 2016-06-08 MED ORDER — SODIUM CHLORIDE 0.9 % IV BOLUS (SEPSIS)
1000.0000 mL | Freq: Once | INTRAVENOUS | Status: AC
  Administered 2016-06-08: 1000 mL via INTRAVENOUS

## 2016-06-08 NOTE — ED Provider Notes (Signed)
[facility] Provider Note   CSN: [id number] Arrival date & time: 06/08/16  1541  By signing my name below, I, Teofilo PodMatthew P. Jamison, attest that this documentation has been prepared under the direction and in the presence of Gwyneth SproutWhitney Virgilio Broadhead, MD . Electronically Signed: Teofilo PodMatthew P. Jamison, ED Scribe. 06/08/2016. 5:13 PM.  History   Chief Complaint Chief Complaint  Patient presents with  . Weakness    The history is provided by the patient. No language interpreter was used.   HPI Comments:  Gregory Woodward is a 30 y.o. male with PMHx of ulcerative colitis who presents to the Emergency Department complaining of constant generalized weakness x1 day. Pt states that at work yesterday he was having chills/sweats fluctuations and that it was difficult for him to get back to his car. Pt reports associated vomiting, blood in stool, fever, and LUQ abdominal pain. Pt states that the abdominal pain has become unbearable, and notes that his stool is ~15% blood. Pt states that he has previously taken prednisone for his ulcerative colitis that provided relief. No alleviating factors noted for his current symptoms. Pt denies other associated symptoms.    Past Medical History:  Diagnosis Date  . Ulcerative colitis (HCC)     There are no active problems to display for this patient.   History reviewed. No pertinent surgical history.     Home Medications    Prior to Admission medications   Medication Sig Start Date End Date Taking? Authorizing Provider  dicyclomine (BENTYL) 20 MG tablet Take 1 tablet (20 mg total) by mouth every 6 (six) hours as needed for spasms (for abdominal cramping). 03/20/15   Marisa Severinlga Otter, MD  dicyclomine (BENTYL) 20 MG tablet Take 1 tablet (20 mg total) by mouth 2 (two) times daily. 04/09/15   Tatyana Kirichenko, PA-C  docusate sodium (COLACE) 100 MG capsule Take 1 capsule (100 mg total) by mouth every 12 (twelve) hours. 03/20/15   Marisa Severinlga Otter, MD  famotidine (PEPCID) 20 MG  tablet Take 1 tablet (20 mg total) by mouth 2 (two) times daily. 04/09/15   Tatyana Kirichenko, PA-C  polyethylene glycol (MIRALAX / GLYCOLAX) packet Take one capful in 8 oz of fluid of your choice twice a day until having soft stools, then back down to once a day.  If not having soft stools with twice a day, increased to three times a day 03/20/15   Marisa Severinlga Otter, MD    Family History History reviewed. No pertinent family history.  Social History Social History  Substance Use Topics  . Smoking status: Former Games developermoker  . Smokeless tobacco: Never Used  . Alcohol use No     Comment: social     Allergies   Onion   Review of Systems Review of Systems  Constitutional: Positive for fever.  Gastrointestinal: Positive for abdominal pain, blood in stool and vomiting.  Neurological: Positive for weakness.  All other systems reviewed and are negative.    Physical Exam Updated Vital Signs BP 125/100 (BP Location: Left Arm)   Pulse 75   Temp 97.9 F (36.6 C) (Oral)   Resp 20   Ht 6' (1.829 m)   Wt 173 lb (78.5 kg)   SpO2 100%   BMI 23.46 kg/m   Physical Exam  Constitutional: He appears well-developed and well-nourished.  HENT:  Head: Normocephalic and atraumatic.  Eyes: Conjunctivae are normal.  Neck: Neck supple.  Cardiovascular: Normal rate and regular rhythm.   No murmur heard. Pulmonary/Chest: Effort normal and breath sounds  normal. No respiratory distress.  Abdominal: Soft. There is no tenderness. There is guarding. There is no rebound.  LUQ and LLQ pain.  Musculoskeletal: He exhibits no edema.  Neurological: He is alert.  Skin: Skin is warm and dry.  Psychiatric: He has a normal mood and affect.  Nursing note and vitals reviewed.    ED Treatments / Results  DIAGNOSTIC STUDIES:  Oxygen Saturation is 100% on RA, normal by my interpretation.    COORDINATION OF CARE:  5:09 PM Discussed treatment plan with pt at bedside and pt agreed to plan.   Labs (all labs  ordered are listed, but only abnormal results are displayed) Labs Reviewed - No data to display  EKG  EKG Interpretation None       Radiology No results found.  Procedures Procedures (including critical care time)  Medications Ordered in ED Medications - No data to display   Initial Impression / Assessment and Plan / ED Course  I have reviewed the triage vital signs and the nursing notes.  Pertinent labs & imaging results that were available during my care of the patient were reviewed by me and considered in my medical decision making (see chart for details).  Clinical Course   Patient is a 30 year old male with a history of ulcerative colitis. He comes in today with a 2-3 day history of a flare. He has had worsening abdominal pain, vomiting and blood in his stool. He states his last flare was maybe a year ago because he had been eating well and trying to stay away from things that flare his disease. He does not have a GI doctor at this time because he does not have insurance but will be getting insurance in the next 20 days. He currently takes no medication for his ulcerative colitis. Vital signs today are within normal limits. Patient is afebrile. On exam patient has tenderness in the left lower and left upper quadrants. No right lower quadrant tenderness concerning for appendicitis. Feel most likely patient is having a flare of his UC. CBC, CMP, lipase pending. Patient given IV fluids, pain and nausea medication.  Labs wnl.  Pt feeling better after 1 round of meds.  Pt d/ced home with pain meds, nausea meds and prednisone.  Final Clinical Impressions(s) / ED Diagnoses   Final diagnoses:  Ulcerative colitis with rectal bleeding, unspecified location [facility])    New Prescriptions Discharge Medication List as of 06/08/2016  6:53 PM    START taking these medications   Details  HYDROcodone-acetaminophen (NORCO/VICODIN) 5-325 MG tablet Take 1-2 tablets by mouth every 6 (six)  hours as needed., Starting Sun 06/08/2016, Print    ondansetron (ZOFRAN) 4 MG tablet Take 1 tablet (4 mg total) by mouth every 6 (six) hours., Starting Sun 06/08/2016, Print    predniSONE (DELTASONE) 20 MG tablet Take 2 tablets (40 mg total) by mouth daily. Take 40mg  for 5 days, then 30mg  (1.5 tabs) for 3 days, then 20mg  (1tab) for 3 days, then 10mg  (.5mg  tab) for 2 days, Starting Sun 06/08/2016, Print       I personally performed the services described in this documentation, which was scribed in my presence.  The recorded information has been reviewed and considered.     Gwyneth Sprout, MD 06/08/16 [phone]

## 2016-06-08 NOTE — ED Triage Notes (Signed)
States for the past couple of days has felt weak and having vomiting with bright red blood in stools for months? , also c/o of LUQ pain for months, as well.

## 2016-06-08 NOTE — ED Notes (Signed)
Coke provided to pt - OK'd by MD.

## 2016-06-08 NOTE — ED Notes (Signed)
Pt made aware to return if symptoms worsen or if any life threatening symptoms occur.   

## 2016-09-07 ENCOUNTER — Emergency Department (HOSPITAL_COMMUNITY)
Admission: EM | Admit: 2016-09-07 | Discharge: 2016-09-08 | Disposition: A | Discharge status: Home / Self Care | Payer: Self-pay | Attending: Emergency Medicine | Admitting: Emergency Medicine

## 2016-09-07 ENCOUNTER — Encounter (HOSPITAL_COMMUNITY): Payer: Self-pay | Admitting: Emergency Medicine

## 2016-09-07 DIAGNOSIS — K51919 Ulcerative colitis, unspecified with unspecified complications: Secondary | ICD-10-CM

## 2016-09-07 DIAGNOSIS — Z87891 Personal history of nicotine dependence: Secondary | ICD-10-CM | POA: Insufficient documentation

## 2016-09-07 DIAGNOSIS — K5190 Ulcerative colitis, unspecified, without complications: Secondary | ICD-10-CM | POA: Insufficient documentation

## 2016-09-07 LAB — LIPASE, BLOOD: Lipase: 17 U/L (ref 11–51)

## 2016-09-07 LAB — COMPREHENSIVE METABOLIC PANEL
ALBUMIN: 4.7 g/dL (ref 3.5–5.0)
ALT: 13 U/L — ABNORMAL LOW (ref 17–63)
AST: 21 U/L (ref 15–41)
Alkaline Phosphatase: 52 U/L (ref 38–126)
Anion gap: 10 (ref 5–15)
BILIRUBIN TOTAL: 1.8 mg/dL — AB (ref 0.3–1.2)
BUN: 17 mg/dL (ref 6–20)
CHLORIDE: 102 mmol/L (ref 101–111)
CO2: 23 mmol/L (ref 22–32)
Calcium: 9.2 mg/dL (ref 8.9–10.3)
Creatinine, Ser: 0.9 mg/dL (ref 0.61–1.24)
GFR calc Af Amer: >60 mL/min (ref >60)
GFR calc non Af Amer: >60 mL/min (ref >60)
GLUCOSE: 89 mg/dL (ref 65–99)
POTASSIUM: 3.6 mmol/L (ref 3.5–5.1)
SODIUM: 135 mmol/L (ref 135–145)
TOTAL PROTEIN: 7.4 g/dL (ref 6.5–8.1)

## 2016-09-07 LAB — CBC
HEMATOCRIT: 45 % (ref 39.0–52.0)
Hemoglobin: 15.5 g/dL (ref 13.0–17.0)
MCH: 26.1 pg (ref 26.0–34.0)
MCHC: 34.4 g/dL (ref 30.0–36.0)
MCV: 75.6 fL — AB (ref 78.0–100.0)
Platelets: 201 10*3/uL (ref 150–400)
RBC: 5.95 MIL/uL — ABNORMAL HIGH (ref 4.22–5.81)
RDW: 13.6 % (ref 11.5–15.5)
WBC: 8.8 10*3/uL (ref 4.0–10.5)

## 2016-09-07 LAB — URINALYSIS, ROUTINE W REFLEX MICROSCOPIC
BILIRUBIN URINE: NEGATIVE
GLUCOSE, UA: NEGATIVE mg/dL
HGB URINE DIPSTICK: NEGATIVE
Leukocytes, UA: NEGATIVE
Nitrite: NEGATIVE
PH: 6 (ref 5.0–8.0)
Protein, ur: NEGATIVE mg/dL
SPECIFIC GRAVITY, URINE: 1.025 (ref 1.005–1.030)

## 2016-09-07 LAB — POC OCCULT BLOOD, ED: Fecal Occult Bld: NEGATIVE

## 2016-09-07 MED ORDER — HYDROMORPHONE HCL 1 MG/ML IJ SOLN
1.0000 mg | Freq: Once | INTRAMUSCULAR | Status: AC
  Administered 2016-09-07: 1 mg via INTRAVENOUS
  Filled 2016-09-07: qty 1

## 2016-09-07 MED ORDER — ONDANSETRON HCL 4 MG/2ML IJ SOLN
4.0000 mg | Freq: Once | INTRAMUSCULAR | Status: AC
  Administered 2016-09-07: 4 mg via INTRAVENOUS
  Filled 2016-09-07: qty 2

## 2016-09-07 MED ORDER — METHYLPREDNISOLONE SODIUM SUCC 125 MG IJ SOLR
60.0000 mg | Freq: Once | INTRAMUSCULAR | Status: AC
  Administered 2016-09-07: 60 mg via INTRAVENOUS
  Filled 2016-09-07: qty 2

## 2016-09-07 MED ORDER — SODIUM CHLORIDE 0.9 % IV SOLN
Freq: Once | INTRAVENOUS | Status: AC
  Administered 2016-09-07: 21:00:00 via INTRAVENOUS

## 2016-09-07 NOTE — ED Triage Notes (Addendum)
Per EMS pt from home flare up of ulcerative colitis for past 5 hours. Pt nauseated, no emesis today, BM earlier with bleeding.  Pt reports vomiting 8 times last night, to the point he was dry heaving.

## 2016-09-07 NOTE — ED Provider Notes (Addendum)
[facility]-EMERGENCY DEPT Provider Note   CSN: [id number] Arrival date & time: 09/07/16  1557 By signing my name below, I, Levon Hedger, attest that this documentation has been prepared under the direction and in the presence of non-physician practitioner, Earley Favor, NP. Electronically Signed: Levon Hedger, Scribe. 09/07/2016. 8:14 PM.   History   Chief Complaint Chief Complaint  Patient presents with  . Ulcerative Colitis    HPI Gregory Woodward is a 31 y.o. male with a hx of ulcerative colitis who presents to the Emergency Department complaining of severe, constant LLQ pain onset five hours ago. Pt states this is the typical site of pain during UC flares and states this is the most severe his pain has been. He notes associated nausea, vomiting 8x last night, and hematochezia. No medication taken PTA. No alleviating or modifying factors noted. No recent illness.   Pt denies any fever, cough, or diarrhea.   The history is provided by the patient. No language interpreter was used.   Past Medical History:  Diagnosis Date  . Ulcerative colitis (HCC)     There are no active problems to display for this patient.   History reviewed. No pertinent surgical history.   Home Medications    Prior to Admission medications   Medication Sig Start Date End Date Taking? Authorizing Provider  dicyclomine (BENTYL) 20 MG tablet Take 1 tablet (20 mg total) by mouth every 6 (six) hours as needed for spasms (for abdominal cramping). Patient not taking: Reported on 09/07/2016 03/20/15   Marisa Severin, MD  dicyclomine (BENTYL) 20 MG tablet Take 1 tablet (20 mg total) by mouth 2 (two) times daily. Patient not taking: Reported on 09/07/2016 04/09/15   Tatyana Kirichenko, PA-C  docusate sodium (COLACE) 100 MG capsule Take 1 capsule (100 mg total) by mouth every 12 (twelve) hours. Patient not taking: Reported on 09/07/2016 03/20/15   Marisa Severin, MD  famotidine (PEPCID) 20 MG tablet Take 1 tablet (20 mg total) by mouth 2 (two)  times daily. Patient not taking: Reported on 09/07/2016 04/09/15   Jaynie Crumble, PA-C  HYDROcodone-acetaminophen (NORCO/VICODIN) 5-325 MG tablet Take 1-2 tablets by mouth every 6 (six) hours as needed. 09/08/16   Earley Favor, NP  ondansetron (ZOFRAN) 4 MG tablet Take 1 tablet (4 mg total) by mouth every 6 (six) hours. Patient not taking: Reported on 09/07/2016 06/08/16   Gwyneth Sprout, MD  polyethylene glycol (MIRALAX / GLYCOLAX) packet Take one capful in 8 oz of fluid of your choice twice a day until having soft stools, then back down to once a day.  If not having soft stools with twice a day, increased to three times a day Patient not taking: Reported on 09/07/2016 03/20/15   Marisa Severin, MD  predniSONE (DELTASONE) 20 MG tablet Take 2 tablets (40 mg total) by mouth daily. Take 40mg  for 5 days, then 30mg  (1.5 tabs) for 3 days, then 20mg  (1tab) for 3 days, then 10mg  (.5mg  tab) for 2 days 09/08/16   Earley Favor, NP    Family History No family history on file.  Social History Social History  Substance Use Topics  . Smoking status: Former Games developer  . Smokeless tobacco: Never Used  . Alcohol use No     Comment: social    Allergies   Onion  Review of Systems Review of Systems  Constitutional: Negative for chills and fever.  HENT: Negative.   Respiratory: Negative for shortness of breath.   Cardiovascular: Negative for chest pain.  Gastrointestinal: Positive for  abdominal pain, blood in stool, nausea and vomiting. Negative for abdominal distention, constipation, diarrhea and rectal pain.  Genitourinary: Negative for dysuria.  Skin: Positive for pallor.  All other systems reviewed and are negative.  Physical Exam Updated Vital Signs BP 117/70 (BP Location: Left Arm)   Pulse 64   Temp 98.4 F (36.9 C) (Oral)   Resp 14   SpO2 99%   Physical Exam  Constitutional: He is oriented to person, place, and time. He appears well-developed and well-nourished.  HENT:  Head: Normocephalic.  Eyes:  Pupils are equal, round, and reactive to light.  Neck: Normal range of motion.  Cardiovascular: Normal rate.   Pulmonary/Chest: Effort normal.  Abdominal: He exhibits no distension and no mass. There is tenderness. There is no rebound and no guarding.  Musculoskeletal: Normal range of motion.  Neurological: He is alert and oriented to person, place, and time.  Skin: Skin is warm and dry.  Psychiatric: He has a normal mood and affect.  Nursing note and vitals reviewed.  ED Treatments / Results  DIAGNOSTIC STUDIES:  Oxygen Saturation is 100% on RA, normal by my interpretation.    COORDINATION OF CARE:  8:14 PM Discussed treatment plan with pt at bedside and pt agreed to plan.   Labs (all labs ordered are listed, but only abnormal results are displayed) Labs Reviewed  COMPREHENSIVE METABOLIC PANEL - Abnormal; Notable for the following:       Result Value   ALT 13 (*)    Total Bilirubin 1.8 (*)    All other components within normal limits  CBC - Abnormal; Notable for the following:    RBC 5.95 (*)    MCV 75.6 (*)    All other components within normal limits  URINALYSIS, ROUTINE W REFLEX MICROSCOPIC - Abnormal; Notable for the following:    Ketones, ur >=80 (*)    All other components within normal limits  LIPASE, BLOOD  POC OCCULT BLOOD, ED    EKG  EKG Interpretation None       Radiology No results found.  Procedures Procedures (including critical care time)  Medications Ordered in ED Medications  0.9 %  sodium chloride infusion ( Intravenous Stopped 09/07/16 2214)  ondansetron (ZOFRAN) injection 4 mg (4 mg Intravenous Given 09/07/16 2058)  HYDROmorphone (DILAUDID) injection 1 mg (1 mg Intravenous Given 09/07/16 2058)  methylPREDNISolone sodium succinate (SOLU-MEDROL) 125 mg/2 mL injection 60 mg (60 mg Intravenous Given 09/07/16 2145)  HYDROmorphone (DILAUDID) injection 1 mg (1 mg Intravenous Given 09/07/16 2248)  HYDROcodone-acetaminophen (NORCO/VICODIN) 5-325 MG per  tablet 1 tablet (1 tablet Oral Given 09/08/16 0021)     Initial Impression / Assessment and Plan / ED Course  I have reviewed the triage vital signs and the nursing notes.  Pertinent labs & imaging results that were available during my care of the patient were reviewed by me and considered in my medical decision making (see chart for details).  Clinical Course    Patient has begin IV fluids, IV Solu-Medrol and Dilaudid for pain and switch to oral pain medication will be discharged home with a steroid taper.  He's been given a referral to gastroenterology for continued treatment    Final Clinical Impressions(s) / ED Diagnoses   Final diagnoses:  Ulcerative colitis with complication, unspecified location [facility](HCC)    New Prescriptions Discharge Medication List as of 09/08/2016  1:24 AM    I personally performed the services described in this documentation, which was scribed in my presence. The recorded  information has been reviewed and is accurate.   Earley Favor, NP 09/08/16 0126    Tilden Fossa, MD 09/08/16 0148    Earley Favor, NP 09/08/16 1952    Tilden Fossa, MD 09/09/16 [phone]

## 2016-09-08 MED ORDER — PREDNISONE 20 MG PO TABS: 40.0000 mg | ORAL_TABLET | Freq: Every day | ORAL | 0 refills | Status: DC

## 2016-09-08 MED ORDER — HYDROCODONE-ACETAMINOPHEN 5-325 MG PO TABS: 1.0000 | ORAL_TABLET | Freq: Four times a day (QID) | ORAL | 0 refills | Status: DC | PRN

## 2016-09-08 MED ORDER — HYDROCODONE-ACETAMINOPHEN 5-325 MG PO TABS
1.0000 | ORAL_TABLET | Freq: Once | ORAL | Status: AC
  Administered 2016-09-08: 1 via ORAL
  Filled 2016-09-08: qty 1

## 2016-09-08 NOTE — Discharge Instructions (Signed)
You happy given a referral to gastroenterology for further evaluation and treatment.  He will also be given a prescription for Vicodin for pain control as well as prednisone which is a steroid.  Please take this as directed until all tablets have been taken

## 2017-01-28 ENCOUNTER — Emergency Department (HOSPITAL_BASED_OUTPATIENT_CLINIC_OR_DEPARTMENT_OTHER)
Admission: EM | Admit: 2017-01-28 | Discharge: 2017-01-28 | Disposition: A | Discharge status: Home / Self Care | Payer: Self-pay | Attending: Emergency Medicine | Admitting: Emergency Medicine

## 2017-01-28 ENCOUNTER — Encounter (HOSPITAL_BASED_OUTPATIENT_CLINIC_OR_DEPARTMENT_OTHER): Payer: Self-pay

## 2017-01-28 DIAGNOSIS — R109 Unspecified abdominal pain: Secondary | ICD-10-CM

## 2017-01-28 DIAGNOSIS — Y929 Unspecified place or not applicable: Secondary | ICD-10-CM | POA: Insufficient documentation

## 2017-01-28 DIAGNOSIS — M546 Pain in thoracic spine: Secondary | ICD-10-CM | POA: Insufficient documentation

## 2017-01-28 DIAGNOSIS — Y939 Activity, unspecified: Secondary | ICD-10-CM | POA: Insufficient documentation

## 2017-01-28 DIAGNOSIS — R1084 Generalized abdominal pain: Secondary | ICD-10-CM | POA: Insufficient documentation

## 2017-01-28 DIAGNOSIS — Z87891 Personal history of nicotine dependence: Secondary | ICD-10-CM | POA: Insufficient documentation

## 2017-01-28 DIAGNOSIS — W11XXXA Fall on and from ladder, initial encounter: Secondary | ICD-10-CM | POA: Insufficient documentation

## 2017-01-28 DIAGNOSIS — G8929 Other chronic pain: Secondary | ICD-10-CM | POA: Insufficient documentation

## 2017-01-28 DIAGNOSIS — Y990 Civilian activity done for income or pay: Secondary | ICD-10-CM | POA: Insufficient documentation

## 2017-01-28 MED ORDER — METHOCARBAMOL 500 MG PO TABS: 500.0000 mg | ORAL_TABLET | Freq: Three times a day (TID) | ORAL | 0 refills | Status: DC | PRN

## 2017-01-28 MED ORDER — HYDROCODONE-ACETAMINOPHEN 5-325 MG PO TABS
2.0000 | ORAL_TABLET | Freq: Once | ORAL | Status: AC
  Administered 2017-01-28: 2 via ORAL
  Filled 2017-01-28: qty 2

## 2017-01-28 MED ORDER — HYDROCODONE-ACETAMINOPHEN 5-325 MG PO TABS: 1.0000 | ORAL_TABLET | Freq: Four times a day (QID) | ORAL | 0 refills | Status: DC | PRN

## 2017-01-28 MED ORDER — ONDANSETRON 4 MG PO TBDP: 4.0000 mg | ORAL_TABLET | Freq: Four times a day (QID) | ORAL | 0 refills | Status: DC | PRN

## 2017-01-28 NOTE — ED Triage Notes (Signed)
Pt c/o of mid to lower bilateral back pain after reaching to grab something at work.  Pt denies incontinence, no groin numbness, no foot numbness, ambulatory without issue.  Pt also c/o chronic abdominal pain for the last few months.  Pt has hx of ulcerative colitis, has insurance, does not have PCP or GI doctor.

## 2017-01-28 NOTE — ED Provider Notes (Signed)
TIME SEEN: 12:56 AM  CHIEF COMPLAINT: Back pain, chronic abdominal pain  HPI: Patient is a 31 year old male with history of ulcerative colitis who presents to the emergency department for 2 complaints. He states the major reason he is here in the emergency department is because of left thoracic back pain. States he was at work tonight he reached up high for something and felt like his back "got locked up". He was standing on a two-step ladder and fell backwards falling onto the ground. He did not hit his head or lose consciousness. Is not on any blood thinners. Complaining of back pain worse with movement. No numbness, tingling or focal weakness. No bowel or bladder incontinence. No urinary retention. Has not taken any medication prior to arrival. He has not been urinating any blood.   Patient also complains of months worth of left-sided crampy abdominal pain that is chronic for him. States it waxes and wanes. He denies any fevers. He has chronic nausea vomiting but has not had any vomiting in over 3 days. No diarrhea. No blood in his stool for the past week. No melena. No history of abdominal surgeries. He states that he deals with chronic abdominal pain on a regular basis. He agrees that he is not in a ulcerative colitis flare at this time. He has not been on medication for ulcerative colitis in months. He does not have a gastroenterologist or primary care physician. He does report that he has insurance but states that "I work so much have not been able to be seen by a doctor."  ROS: See HPI Constitutional: no fever  Eyes: no drainage  ENT: no runny nose   Cardiovascular:  no chest pain  Resp: no SOB  GI: no vomiting GU: no dysuria Integumentary: no rash  Allergy: no hives  Musculoskeletal: no leg swelling  Neurological: no slurred speech ROS otherwise negative  PAST MEDICAL HISTORY/PAST SURGICAL HISTORY:  Past Medical History:  Diagnosis Date  . Ulcerative colitis (HCC)      MEDICATIONS:  Prior to Admission medications   Medication Sig Start Date End Date Taking? Authorizing Provider  dicyclomine (BENTYL) 20 MG tablet Take 1 tablet (20 mg total) by mouth every 6 (six) hours as needed for spasms (for abdominal cramping). Patient not taking: Reported on 09/07/2016 03/20/15   Marisa Severintter, Olga, MD  dicyclomine (BENTYL) 20 MG tablet Take 1 tablet (20 mg total) by mouth 2 (two) times daily. Patient not taking: Reported on 09/07/2016 04/09/15   Jaynie CrumbleKirichenko, Tatyana, PA-C  docusate sodium (COLACE) 100 MG capsule Take 1 capsule (100 mg total) by mouth every 12 (twelve) hours. Patient not taking: Reported on 09/07/2016 03/20/15   Marisa Severintter, Olga, MD  famotidine (PEPCID) 20 MG tablet Take 1 tablet (20 mg total) by mouth 2 (two) times daily. Patient not taking: Reported on 09/07/2016 04/09/15   Jaynie CrumbleKirichenko, Tatyana, PA-C  HYDROcodone-acetaminophen (NORCO/VICODIN) 5-325 MG tablet Take 1-2 tablets by mouth every 6 (six) hours as needed. Patient not taking: Reported on 01/28/2017 09/08/16   Earley FavorSchulz, Gail, NP  ondansetron (ZOFRAN) 4 MG tablet Take 1 tablet (4 mg total) by mouth every 6 (six) hours. Patient not taking: Reported on 09/07/2016 06/08/16   Gwyneth SproutPlunkett, Whitney, MD  polyethylene glycol [facility](MIRALAX / GLYCOLAX) packet Take one capful in 8 oz of fluid of your choice twice a day until having soft stools, then back down to once a day.  If not having soft stools with twice a day, increased to three times a day Patient not  taking: Reported on 09/07/2016 03/20/15   Marisa Severin, MD  predniSONE (DELTASONE) 20 MG tablet Take 2 tablets (40 mg total) by mouth daily. Take 40mg  for 5 days, then 30mg  (1.5 tabs) for 3 days, then 20mg  (1tab) for 3 days, then 10mg  (.5mg  tab) for 2 days Patient not taking: Reported on 01/28/2017 09/08/16   Earley Favor, NP    ALLERGIES:  Allergies  Allergen Reactions  . Onion Itching and Swelling    SOCIAL HISTORY:  Social History  Substance Use Topics  . Smoking status: Former Games developer   . Smokeless tobacco: Never Used  . Alcohol use No     Comment: social    FAMILY HISTORY: No family history on file.  EXAM: BP 122/83 (BP Location: Right Arm)   Pulse 60   Temp 98 F (36.7 C) (Oral)   Resp 18   Ht 6' (1.829 m)   Wt 83.9 kg (185 lb)   SpO2 100%   BMI 25.09 kg/m  CONSTITUTIONAL: Alert and oriented and responds appropriately to questions. Well-appearing; well-nourished, Afebrile, nontoxic, well-hydrated HEAD: Normocephalic, atraumatic EYES: Conjunctivae clear, pupils appear equal, EOMI ENT: normal nose; moist mucous membranes NECK: Supple, no meningismus, no nuchal rigidity, no LAD , no midline spinal tenderness or step-off or deformity CARD: RRR; S1 and S2 appreciated; no murmurs, no clicks, no rubs, no gallops RESP: Normal chest excursion without splinting or tachypnea; breath sounds clear and equal bilaterally; no wheezes, no rhonchi, no rales, no hypoxia or respiratory distress, speaking full sentences ABD/GI: Normal bowel sounds; non-distended; soft, very minimally tender to palpation the left upper and lower quadrant, no rebound, no guarding, no peritoneal signs, no hepatosplenomegaly BACK:  The back appears normal and is non-tender to palpation over the midline spine without step-off or deformity, he does have tenderness over the thoracic left paraspinal musculature without any ecchymosis or swelling, there is no CVA tenderness EXT: Normal ROM in all joints; non-tender to palpation; no edema; normal capillary refill; no cyanosis, no calf tenderness or swelling    SKIN: Normal color for age and race; warm; no rash NEURO: Moves all extremities equally, sensation to light touch intact diffusely, cranial nerves II through XII intact, normal gait PSYCH: The patient's mood and manner are appropriate. Grooming and personal hygiene are appropriate.  MEDICAL DECISION MAKING: Patient here with thoracic spasm, strain and contusion. No obvious sign of trauma on exam. No  midline tenderness. I do not feel he needs imaging at this time emergently. No neurologic deficits. We'll give him Vicodin, Robaxin for symptomatically relief.   Patient also complaining of chronic abdominal pain. He does not feel that this is an ulcerative colitis flare but feels that this is his chronic symptoms. I do not feel labs, imaging of the abdomen are needed at this time emergently. I have discussed with him I recommend close follow-up with her primary care physician and gastroenterologist. He is comfortable with this plan. He agrees that he does not need steroids at this time. He has not vomited in over 3 days and appears well-hydrated. He is afebrile. Abdomen is only mildly tender to palpation and is definitely not a surgical abdomen. He has not seen any blood in his stool and over a week.   At this time, I do not feel there is any life-threatening condition present. I have reviewed and discussed all results (EKG, imaging, lab, urine as appropriate) and exam findings with patient/family. I have reviewed nursing notes and appropriate previous records.  I feel the  patient is safe to be discharged home without further emergent workup and can continue workup as an outpatient as needed. Discussed usual and customary return precautions. Patient/family verbalize understanding and are comfortable with this plan.  Outpatient follow-up has been provided if needed. All questions have been answered.      Ward, Layla Maw, DO 01/28/17 [phone]

## 2017-01-28 NOTE — Discharge Instructions (Signed)
To find a primary care or specialty doctor please call [phone] or [phone] to access "[facility] Find a Doctor Service." ° °You may also go on the [facility] website at [email or web address] ° °There are also multiple [facility], [facility], [facility] and [facility] practices throughout the [place] that are frequently accepting new patients. You may find a clinic that is close to your home and contact them. ° °[facility] -  °[address] °[place] [address] °[phone] ° ° °[facility] -  °[address] °[place] [address] °[phone] ° ° °[facility] - °[address]  °[place] [address] °[phone] ° ° °

## 2017-01-28 NOTE — ED Notes (Signed)
Pt verbalizes understanding of d/c instructions and denies any further needs at this time. 

## 2017-04-08 ENCOUNTER — Emergency Department (HOSPITAL_BASED_OUTPATIENT_CLINIC_OR_DEPARTMENT_OTHER)
Admission: EM | Admit: 2017-04-08 | Discharge: 2017-04-09 | Disposition: A | Discharge status: Home / Self Care | Payer: Self-pay | Attending: Emergency Medicine | Admitting: Emergency Medicine

## 2017-04-08 ENCOUNTER — Emergency Department (HOSPITAL_BASED_OUTPATIENT_CLINIC_OR_DEPARTMENT_OTHER): Payer: Self-pay

## 2017-04-08 ENCOUNTER — Encounter (HOSPITAL_BASED_OUTPATIENT_CLINIC_OR_DEPARTMENT_OTHER): Payer: Self-pay | Admitting: Emergency Medicine

## 2017-04-08 DIAGNOSIS — K047 Periapical abscess without sinus: Secondary | ICD-10-CM | POA: Insufficient documentation

## 2017-04-08 DIAGNOSIS — Z87891 Personal history of nicotine dependence: Secondary | ICD-10-CM | POA: Insufficient documentation

## 2017-04-08 DIAGNOSIS — M542 Cervicalgia: Secondary | ICD-10-CM

## 2017-04-08 HISTORY — DX: Unspecified abdominal pain: R10.9

## 2017-04-08 HISTORY — DX: Other chronic pain: G89.29

## 2017-04-08 LAB — CBC WITH DIFFERENTIAL/PLATELET
BASOS PCT: 1 %
Basophils Absolute: 0 10*3/uL (ref 0.0–0.1)
EOS ABS: 0.2 10*3/uL (ref 0.0–0.7)
Eosinophils Relative: 5 %
HEMATOCRIT: 42.3 % (ref 39.0–52.0)
HEMOGLOBIN: 14.5 g/dL (ref 13.0–17.0)
LYMPHS ABS: 1.7 10*3/uL (ref 0.7–4.0)
Lymphocytes Relative: 32 %
MCH: 26.3 pg (ref 26.0–34.0)
MCHC: 34.3 g/dL (ref 30.0–36.0)
MCV: 76.6 fL — ABNORMAL LOW (ref 78.0–100.0)
MONOS PCT: 7 %
Monocytes Absolute: 0.4 10*3/uL (ref 0.1–1.0)
NEUTROS PCT: 55 %
Neutro Abs: 2.9 10*3/uL (ref 1.7–7.7)
Platelets: 196 10*3/uL (ref 150–400)
RBC: 5.52 MIL/uL (ref 4.22–5.81)
RDW: 13.9 % (ref 11.5–15.5)
WBC: 5.3 10*3/uL (ref 4.0–10.5)

## 2017-04-08 NOTE — ED Triage Notes (Signed)
C/o right earache x 2-3 days-swelling to right side of neck x 1 week-NAD-steady gait

## 2017-04-08 NOTE — ED Notes (Signed)
Pt called to treatment room with no answer from lobby.  

## 2017-04-08 NOTE — ED Provider Notes (Signed)
[facility]   CSN: [id number] Arrival date & time: 04/08/17  2029     History   Chief Complaint Chief Complaint  Patient presents with  . Neck Pain    HPI Gregory Woodward is a 31 y.o. male who presents with 3 days of progressive worsening right-sided neck pain and swelling. Patient states that initially he noticed a small bump to the right side of his neck that has since progressively gotten larger and more painful. Patient states that he had to leave work today secondary to symptoms. Patient states that he also has had some pain that extends up to the right side of his face. No overlying warmth or erythema noted. Patient has been able to tolerate by mouth and his secretions but does report that his pain is worsened with swallowing and he feels like he has some mild difficulty swallowing. No history of trauma or injury. Patient denies any fevers/chills, Weight loss, sore throat, dental pain, chest pain, difficulty breathing, hearing loss. Patient does have a family history of thyroid and throat cancer.  The history is provided by the patient.    Past Medical History:  Diagnosis Date  . Chronic abdominal pain   . Ulcerative colitis (HCC)     There are no active problems to display for this patient.   History reviewed. No pertinent surgical history.     Home Medications    Prior to Admission medications   Medication Sig Start Date End Date Taking? Authorizing Provider  amoxicillin (AMOXIL) 500 MG capsule Take 1 capsule (500 mg total) by mouth 3 (three) times daily. 04/09/17   Maxwell Caul, PA-C    Family History No family history on file.  Social History Social History  Substance Use Topics  . Smoking status: Former Games developer  . Smokeless tobacco: Never Used  . Alcohol use No     Allergies   Onion   Review of Systems Review of Systems  Constitutional: Negative for fever and unexpected weight change.  HENT: Positive for trouble swallowing.  Negative for drooling, ear pain and sore throat.   Respiratory: Negative for cough and shortness of breath.   Cardiovascular: Negative for chest pain.  Gastrointestinal: Negative for abdominal pain, nausea and vomiting.  Genitourinary: Negative for dysuria and hematuria.  Musculoskeletal: Positive for neck pain.  Neurological: Negative for headaches.     Physical Exam Updated Vital Signs BP 111/76   Pulse 74   Temp 98.2 F (36.8 C) (Oral)   Resp 18   Ht 6' (1.829 m)   Wt 78.5 kg (173 lb)   SpO2 99%   BMI 23.46 kg/m   Physical Exam  Constitutional: He is oriented to person, place, and time. He appears well-developed and well-nourished.  Sitting comfortably on examination table  HENT:  Head: Normocephalic and atraumatic.  Right Ear: Tympanic membrane normal. No mastoid tenderness.  Left Ear: Tympanic membrane normal. No mastoid tenderness.  Mouth/Throat: Uvula is midline, oropharynx is clear and moist and mucous membranes are normal. No oropharyngeal exudate or posterior oropharyngeal edema.  Bilateral TMs are normal in appearance. No tenderness palpation to the mastoid process bilaterally. No overlying warmth, erythema. No trismus. Uvula is midline. No evidence of dental abscess. Multiple scattered dental caries.  Eyes: Pupils are equal, round, and reactive to light. Conjunctivae, EOM and lids are normal.  Neck: Normal range of motion. Neck supple. No spinous process tenderness present. No neck rigidity. No thyroid mass present.  Flexion/extension of neck intact without  difficulty. Patient has some mild subjective pain with lateral movement to the left. Neck is supple without any signs of rigidity. Tenderness palpation to the right lower neck. There is evidence of a small right cervical lymphadenopathy. No overlying warmth,erythem. No abnormalities with swallowing.   Cardiovascular: Normal rate, regular rhythm, normal heart sounds and normal pulses.   Pulmonary/Chest: Effort normal  and breath sounds normal.  No evidence of respiratory distress. Able to speak in full sentences without difficulty.  Abdominal: Soft. Normal appearance. There is no tenderness. There is no rigidity and no guarding.  Musculoskeletal: Normal range of motion.  Lymphadenopathy:    He has cervical adenopathy.       Right cervical: Superficial cervical adenopathy present.  Neurological: He is alert and oriented to person, place, and time.  Skin: Skin is warm and dry. Capillary refill takes less than 2 seconds.  Psychiatric: He has a normal mood and affect. His speech is normal.  Nursing note and vitals reviewed.   ED Treatments / Results  Labs (all labs ordered are listed, but only abnormal results are displayed) Labs Reviewed  CBC WITH DIFFERENTIAL/PLATELET - Abnormal; Notable for the following:       Result Value   MCV 76.6 (*)    All other components within normal limits  BASIC METABOLIC PANEL    EKG  EKG Interpretation None       Radiology Ct Soft Tissue Neck W Contrast  Result Date: 04/09/2017 CLINICAL DATA:  RIGHT neck lump and the ear pain for 1 week. EXAM: CT NECK WITH CONTRAST TECHNIQUE: Multidetector CT imaging of the neck was performed using the standard protocol following the bolus administration of intravenous contrast. CONTRAST:  75mL ISOVUE-300 IOPAMIDOL (ISOVUE-300) INJECTION 61% COMPARISON:  None. FINDINGS: PHARYNX AND LARYNX: Normal.  Widely patent airway. SALIVARY GLANDS: Normal. THYROID: Normal. LYMPH NODES: Reniform 10 mm RIGHT level 1 B lymph node corresponding to palpable abnormality, reniform morphology and homogeneous enhancement. Scattered subcentimeter benign-appearing lymph nodes. VASCULAR: Normal. LIMITED INTRACRANIAL: Normal. VISUALIZED ORBITS: Normal. MASTOIDS AND VISUALIZED PARANASAL SINUSES: Mild paranasal sinus mucosal thickening. Mastoid air cells are well aerated. SKELETON: Nonacute. Mildly hypertrophied RIGHT masseter muscle. Trace tooth 30 periapical  lucency. A few scattered dental caries. UPPER CHEST: Lung apices are clear. Minimal RIGHT apical bullous changes No superior mediastinal lymphadenopathy. OTHER: None. IMPRESSION: 1. RIGHT level 1 B lymph node with benign imaging features corresponding to palpable abnormality. Findings possibly odontogenic given early tooth 30 periapical abscess. 2. No acute process in the neck. Electronically Signed   By: Awilda Metroourtnay  Bloomer M.D.   On: 04/09/2017 01:12    Procedures Procedures (including critical care time)  Medications Ordered in ED Medications  iopamidol (ISOVUE-300) 61 % injection 75 mL (75 mLs Intravenous Contrast Given 04/09/17 0042)     Initial Impression / Assessment and Plan / ED Course  I have reviewed the triage vital signs and the nursing notes.  Pertinent labs & imaging results that were available during my care of the patient were reviewed by me and considered in my medical decision making (see chart for details).     31 year old male with PMH/o ulcerative colitis who presents with 3 days of progressively worsening right-sided neck pain and swelling. Patient came to the emergency Department because he is concerned since he has a family history of throat and thyroid cancer. Physical exam shows some cervical lymphadenopathy on the right lower neck. Airways patent. No evidence of peritonsillar abscess or Ludwig angina. History/physical exam are not concerning for  mastoiditis. Will check basic labs for evaluation. Discussed patient with Dr. Rush Landmark. Given patient's history/physical exam and significant family history, we'll plan to evaluate with CT of the neck for evaluation.  Labs and imaging reviewed.  CBC within normal limits. BMP within normal limits. CT of the neck shows a 10 mm lymph node that appears to be benign that is corresponding to the palpable abnormality. There is suspicion that there may be an odontogenic source for the possible early abscess to tooth #30. White negative for  any acute abnormality in the neck. Discussed results with patient. We'll plan to treat with antibiotic coverage for potential abscess. Patient given a list of outpatient dental referrals. Instructed him to follow up with the dental resource guide. Provided patient with a list of clinic resources to use if he does not have a PCP. Instructed to call them today to arrange follow-up in the next 24-48 hours. Strict eturn precautions discussed. Patient expresses understanding and agreement to plan.    Final Clinical Impressions(s) / ED Diagnoses   Final diagnoses:  Neck pain  Dental abscess    New Prescriptions Discharge Medication List as of 04/09/2017  1:32 AM    START taking these medications   Details  amoxicillin (AMOXIL) 500 MG capsule Take 1 capsule (500 mg total) by mouth 3 (three) times daily., Starting Thu 04/09/2017, Print         Maxwell Caul, PA-C 04/09/17 0214    Tegeler, Canary Brim, MD 04/09/17 1316

## 2017-04-09 LAB — BASIC METABOLIC PANEL
Anion gap: 7 (ref 5–15)
BUN: 14 mg/dL (ref 6–20)
CO2: 31 mmol/L (ref 22–32)
CREATININE: 0.95 mg/dL (ref 0.61–1.24)
Calcium: 9.3 mg/dL (ref 8.9–10.3)
Chloride: 101 mmol/L (ref 101–111)
GFR calc Af Amer: >60 mL/min (ref >60)
GFR calc non Af Amer: >60 mL/min (ref >60)
GLUCOSE: 85 mg/dL (ref 65–99)
Potassium: 3.8 mmol/L (ref 3.5–5.1)
Sodium: 139 mmol/L (ref 135–145)

## 2017-04-09 MED ORDER — AMOXICILLIN 500 MG PO CAPS: 500.0000 mg | ORAL_CAPSULE | Freq: Three times a day (TID) | ORAL | 0 refills | Status: DC

## 2017-04-09 MED ORDER — IOPAMIDOL (ISOVUE-300) INJECTION 61%
75.0000 mL | Freq: Once | INTRAVENOUS | Status: AC | PRN
  Administered 2017-04-09: 75 mL via INTRAVENOUS

## 2017-04-09 NOTE — ED Notes (Signed)
Patient transported to CT 

## 2017-04-09 NOTE — Discharge Instructions (Signed)
You can take Tylenol or Ibuprofen as directed for pain.  Take antibiotics as directed. Please take all of your antibiotics until finished.  Follow-up with referred dentist or use the dental resource guide to find a dentist.  Return to the emergency department for any worsening pain, redness/swelling of the face, fever, difficulty breathing, difficulty swelling or any other worsening or concerning symptoms.

## 2018-02-10 ENCOUNTER — Emergency Department (HOSPITAL_COMMUNITY)
Admission: EM | Admit: 2018-02-10 | Discharge: 2018-02-10 | Disposition: A | Discharge status: Home / Self Care | Payer: Self-pay | Attending: Emergency Medicine | Admitting: Emergency Medicine

## 2018-02-10 DIAGNOSIS — Z87891 Personal history of nicotine dependence: Secondary | ICD-10-CM | POA: Insufficient documentation

## 2018-02-10 DIAGNOSIS — R1032 Left lower quadrant pain: Secondary | ICD-10-CM | POA: Insufficient documentation

## 2018-02-10 LAB — COMPREHENSIVE METABOLIC PANEL
ALBUMIN: 4 g/dL (ref 3.5–5.0)
ALK PHOS: 53 U/L (ref 38–126)
ALT: 18 U/L (ref 17–63)
ANION GAP: 6 (ref 5–15)
AST: 21 U/L (ref 15–41)
BILIRUBIN TOTAL: 0.7 mg/dL (ref 0.3–1.2)
BUN: 11 mg/dL (ref 6–20)
CO2: 27 mmol/L (ref 22–32)
Calcium: 9.2 mg/dL (ref 8.9–10.3)
Chloride: 108 mmol/L (ref 101–111)
Creatinine, Ser: 0.91 mg/dL (ref 0.61–1.24)
GFR calc Af Amer: >60 mL/min (ref >60)
GFR calc non Af Amer: >60 mL/min (ref >60)
GLUCOSE: 110 mg/dL — AB (ref 65–99)
POTASSIUM: 4.2 mmol/L (ref 3.5–5.1)
SODIUM: 141 mmol/L (ref 135–145)
TOTAL PROTEIN: 6.6 g/dL (ref 6.5–8.1)

## 2018-02-10 LAB — URINALYSIS, ROUTINE W REFLEX MICROSCOPIC
BILIRUBIN URINE: NEGATIVE
Glucose, UA: NEGATIVE mg/dL
Hgb urine dipstick: NEGATIVE
KETONES UR: NEGATIVE mg/dL
Leukocytes, UA: NEGATIVE
NITRITE: NEGATIVE
PH: 5 (ref 5.0–8.0)
Protein, ur: NEGATIVE mg/dL
SPECIFIC GRAVITY, URINE: 1.01 (ref 1.005–1.030)

## 2018-02-10 LAB — CBC
HEMATOCRIT: 36.1 % — AB (ref 39.0–52.0)
HEMOGLOBIN: 11.9 g/dL — AB (ref 13.0–17.0)
MCH: 26.1 pg (ref 26.0–34.0)
MCHC: 33 g/dL (ref 30.0–36.0)
MCV: 79.2 fL (ref 78.0–100.0)
Platelets: 215 10*3/uL (ref 150–400)
RBC: 4.56 MIL/uL (ref 4.22–5.81)
RDW: 13.7 % (ref 11.5–15.5)
WBC: 4.2 10*3/uL (ref 4.0–10.5)

## 2018-02-10 LAB — LIPASE, BLOOD: Lipase: 21 U/L (ref 11–51)

## 2018-02-10 LAB — POC OCCULT BLOOD, ED: Fecal Occult Bld: NEGATIVE

## 2018-02-10 MED ORDER — ONDANSETRON HCL 4 MG PO TABS: 4.0000 mg | ORAL_TABLET | Freq: Three times a day (TID) | ORAL | 0 refills | Status: DC | PRN

## 2018-02-10 MED ORDER — KETOROLAC TROMETHAMINE 15 MG/ML IJ SOLN: 15.0000 mg | Freq: Once | INTRAMUSCULAR | Status: DC

## 2018-02-10 MED ORDER — SODIUM CHLORIDE 0.9 % IV BOLUS
1000.0000 mL | Freq: Once | INTRAVENOUS | Status: AC
  Administered 2018-02-10: 1000 mL via INTRAVENOUS

## 2018-02-10 MED ORDER — PREDNISONE 20 MG PO TABS: 40.0000 mg | ORAL_TABLET | Freq: Every day | ORAL | 0 refills | Status: AC

## 2018-02-10 MED ORDER — MORPHINE SULFATE (PF) 4 MG/ML IV SOLN
4.0000 mg | Freq: Once | INTRAVENOUS | Status: AC
  Administered 2018-02-10: 4 mg via INTRAVENOUS
  Filled 2018-02-10: qty 1

## 2018-02-10 NOTE — ED Triage Notes (Addendum)
Transported by [facility] from work--onset of generalized abdominal pain that started this morning. Hx of ulcerative colitis. Associated symptoms: near syncope, nausea/vomiting, and weakness. 4 mg of Zofran administered PTA, pt reports a lot of recent stress.

## 2018-02-10 NOTE — Discharge Instructions (Addendum)
Nice to meet you Mr. Gregory DieterRosa.  Your symptoms may have been due to a mild flare of your ulcerative colitis. Your labs otherwise looked reassuring. We are going to prescribe a short course of prednisone to help treat this so it does not get any worse. Going forward, it will be important to get a GI doctor that can get you on a daily medicine to help prevent flares and make sure everything is well treated. This may be difficult and it may be a good first step to get a primary care doctor. There are places who see uninsured patients such as the community health and wellness center. We have included the number to call and see if you can make an appointment or see about what assistance programs are available.

## 2018-02-10 NOTE — ED Provider Notes (Signed)
[facility]-EMERGENCY DEPT Provider Note   CSN: [id number] Arrival date & time: 02/10/18  [id number]  History   Chief Complaint Chief Complaint  Patient presents with  . Abdominal Pain    HPI Gregory Woodward is a 32 y.o. male with a past medical history of ulcerative colitis who presented to the ED with complaints of abdominal pain, near syncope.  Patient reports onset of abdominal pain and episode of near syncope this morning at about 830 while he was at work, no syncope.  He states his abdominal pain is constant, worse in the left lower quadrant, feels like a "torn" pain.  He states it is slightly different than his typical ulcerative colitis pain but similar location.  He had nausea and an episode of emesis  light brown in color.  No diarrhea or bloody bowel movements, no fever, no chills, no hematemesis.  He notes an associated headache. He reports his sx improved following Zofran provided by EMS. He states he has not seen a gastroenterologist for his ulcerative colitis.   Past Medical History:  Diagnosis Date  . Chronic abdominal pain   . Ulcerative colitis (HCC)       Home Medications    Prior to Admission medications   Medication Sig Start Date End Date Taking? Authorizing Provider  ondansetron (ZOFRAN) 4 MG tablet Take 1 tablet (4 mg total) by mouth every 8 (eight) hours as needed for nausea or vomiting. 02/10/18 02/10/19  Ginger CarneHarden, Reeva Davern, MD  predniSONE (DELTASONE) 20 MG tablet Take 2 tablets (40 mg total) by mouth daily with breakfast for 5 days. 02/10/18 02/15/18  Ginger CarneHarden, Milliana Reddoch, MD    Family History No family history on file.  Social History Social History   Tobacco Use  . Smoking status: Former Games developermoker  . Smokeless tobacco: Never Used  Substance Use Topics  . Alcohol use: No  . Drug use: No   Notes occasional drug use with "pain pills", marijuana.  Last use several months ago. Denies alcohol or current tobacco use.    Allergies   Onion   Review of  Systems Review of Systems  Constitutional: Negative for chills and fever.  Gastrointestinal: Positive for abdominal pain and vomiting. Negative for anal bleeding.  Skin: Negative for rash.  Neurological: Positive for headaches.     Physical Exam Updated Vital Signs BP 120/71 (BP Location: Right Arm)   Pulse 78   Temp 98 F (36.7 C) (Oral)   Resp 17   SpO2 100%   General: Resting on ED stretcher, sleeping but easily awoken, no acute distress Head: Normocephalic, atraumatic  Eyes: PERRL, EOMI, normal conjuctiva  ENT: Moist mucus membranes, torus palatinus  CV: RRR, no murmur appreciated  Resp: Clear breath sounds bilaterally, normal work of breathing, no distress  Abd: Soft, +BS, TTP to LLQ without rebound or guarding, no hepatomegaly Rectal: No external lesions or internal lesions palpated, no gross blood Extr: No LE edema, good peripheral pulses  Neuro: Alert and oriented x3, fatigued but easily awoken and interacts appropriately, normal strength and coordination,  Skin: Warm, dry, normal skin turgor     ED Treatments / Results  Labs (all labs ordered are listed, but only abnormal results are displayed) Labs Reviewed  COMPREHENSIVE METABOLIC PANEL - Abnormal; Notable for the following components:      Result Value   Glucose, Bld 110 (*)    All other components within normal limits  CBC - Abnormal; Notable for the following components:   Hemoglobin 11.9 (*)  HCT 36.1 (*)    All other components within normal limits  LIPASE, BLOOD  URINALYSIS, ROUTINE W REFLEX MICROSCOPIC  POC OCCULT BLOOD, ED      Radiology No results found.  Procedures EKG:   Date: 02/10/2018  Rate: 67  Rhythm: normal sinus rhythm  QRS Axis: normal  Intervals: normal  ST/T Wave abnormalities: normal  Conduction Disutrbances: none  Narrative Interpretation:   Old EKG Reviewed: No prior EKG available   Medications Ordered in ED Medications  sodium chloride 0.9 % bolus 1,000 mL  (1,000 mLs Intravenous New Bag/Given 02/10/18 0941)  morphine 4 MG/ML injection 4 mg (4 mg Intravenous Given 02/10/18 1056)     Initial Impression / Assessment and Plan / ED Course  I have reviewed the triage vital signs and the nursing notes.  Pertinent labs & imaging results that were available during my care of the patient were reviewed by me and considered in my medical decision making (see chart for details).  32 yo M with a past medical history of UC presenting with recent onset of abdominal pain, emesis, and episode of near syncope while at work. Pain is to LLQ similar to prior episodes of UC flare though slightly different in character. Labs show decrease in Hgb from prior down to 11.9 compared to ~15 on prior presentation, no leukocytosis. May be having chronic small blood loss with UC not currently being treated with maintenance medications, though occult blood negative. CMP wnl and unremarkable, lipase normal. Will treat with pain control, IVF. Attempt PO challenge after improvement with zofran.   Pt improved with symptomatic treatment here and tolerating PO intake. Stable for discharge and will prescribe short prednisone course to prevent this suspected flare from worsening. Counseled pt on need for PCP and GI follow up, though difficult without insurance currently. Resource provided for [employer] and wellness center.   Final Clinical Impressions(s) / ED Diagnoses   Final diagnoses:  Left lower quadrant pain    ED Discharge Orders        Ordered    predniSONE (DELTASONE) 20 MG tablet  Daily with breakfast     02/10/18 1238    ondansetron (ZOFRAN) 4 MG tablet  Every 8 hours PRN     02/10/18 1254       Ginger Carne, MD 02/10/18 1415    Derwood Kaplan, MD 02/11/18 1403

## 2018-02-10 NOTE — ED Notes (Signed)
Bed: WA19 Expected date:  Expected time:  Means of arrival:  Comments: EMS/syncope 

## 2018-09-07 ENCOUNTER — Ambulatory Visit: Payer: Self-pay

## 2018-09-16 ENCOUNTER — Encounter: Payer: Self-pay | Admitting: Gerontology

## 2018-09-16 ENCOUNTER — Ambulatory Visit: Payer: Worker's Compensation | Admitting: Pharmacy Technician

## 2018-09-16 ENCOUNTER — Ambulatory Visit: Payer: Self-pay | Admitting: Gerontology

## 2018-09-16 ENCOUNTER — Other Ambulatory Visit: Payer: Self-pay

## 2018-09-16 VITALS — BP 113/79 | HR 70 | Ht 72.0 in | Wt 227.8 lb

## 2018-09-16 DIAGNOSIS — Z79899 Other long term (current) drug therapy: Secondary | ICD-10-CM

## 2018-09-16 DIAGNOSIS — K0889 Other specified disorders of teeth and supporting structures: Secondary | ICD-10-CM

## 2018-09-16 DIAGNOSIS — Z8719 Personal history of other diseases of the digestive system: Secondary | ICD-10-CM

## 2018-09-16 DIAGNOSIS — Z7689 Persons encountering health services in other specified circumstances: Secondary | ICD-10-CM

## 2018-09-16 NOTE — Progress Notes (Signed)
Patient: Gregory Woodward Male    DOB: 01/12/1986   33 y.o.   MRN: [id number] Visit Date: 09/16/2018  Today's Provider: Langston Reusing, NP   Chief Complaint  Patient presents with  . New Patient (Initial Visit)    establish care; needs to see dentist   Subjective:    HPI Gregory Woodward 33 y/o male presents for initial visit and to discuss about having right upper tooth pain due to chipped tooth while eating burger 3 days ago. He reports having non radiating 8/10 shooting pain with eating and drinking cold liquid. He denies taking any medication, and also denies fever, chills.  He reports that he has a history of ulcerative colitis and has not had any exacerbation since his visit to the emergency room June of 2019. He denies abdominal pain, nausea, vomiting, constipation, diarrhea and hematochezia or melena.  He denies chest pain, palpitation, shortness of breath, denies smoking, drinking alcohol and using recreational drugs. Otherwise he states that he's in good health.    Allergies  Allergen Reactions  . Onion Itching and Swelling   Previous Medications   No medications on file    Review of Systems  Constitutional: Negative.   HENT: Positive for dental problem (tooth ache due to chipped tooth).   Eyes: Negative.   Respiratory: Negative.   Cardiovascular: Negative.   Gastrointestinal: Negative.   Endocrine: Negative.   Genitourinary: Negative.   Musculoskeletal: Negative.   Skin: Negative.   Neurological: Negative.   Psychiatric/Behavioral: Negative.     Social History   Tobacco Use  . Smoking status: Former Research scientist (life sciences)  . Smokeless tobacco: Never Used  Substance Use Topics  . Alcohol use: No   Objective:   BP 113/79 (BP Location: Left Arm, Patient Position: Sitting)   Pulse 70   Ht 6' (1.829 m)   Wt 227 lb 12.8 oz (103.3 kg)   SpO2 98%   BMI 30.90 kg/m   Physical Exam Constitutional:      Appearance: Normal appearance.  HENT:     Head: Normocephalic and atraumatic.       Nose: Nose normal.     Mouth/Throat:     Mouth: Mucous membranes are moist.     Dentition: Abnormal dentition (chipped right upper tooth).   Eyes:     Extraocular Movements: Extraocular movements intact.     Pupils: Pupils are equal, round, and reactive to light.  Neck:     Musculoskeletal: Normal range of motion.  Cardiovascular:     Rate and Rhythm: Normal rate and regular rhythm.     Pulses: Normal pulses.     Heart sounds: Normal heart sounds.  Pulmonary:     Effort: Pulmonary effort is normal.     Breath sounds: Normal breath sounds.  Abdominal:     General: Abdomen is flat. Bowel sounds are normal.     Palpations: Abdomen is soft.  Musculoskeletal: Normal range of motion.  Skin:    General: Skin is warm and dry.  Neurological:     General: No focal deficit present.     Mental Status: He is alert and oriented to person, place, and time. Mental status is at baseline.  Psychiatric:        Mood and Affect: Mood normal.        Behavior: Behavior normal.        Thought Content: Thought content normal.        Judgment: Judgment normal.         Assessment &  Plan:         1. Encounter to establish care - He was provided with HIV brochure for screening - He reported receiving Influenza vaccine 2 months ago in 2019 - CBC w/Diff - Comp Met (CMET) - Lipid Profile - Urinalysis - Follow up in 1 year  2. History of ulcerative colitis - He was advised to complete charity care application for possible GI referral  3. Pain in tooth - Dental referral - Follow up in 1 year    Cobb, NP   [facility]

## 2018-09-16 NOTE — Progress Notes (Signed)
Completed [facility] application and contract.  Patient agreed to all terms of the [facility] contract.   Patient approved to receive medication assistance at [facility] as long as eligibility criteria continues to be met.    Provided patient with community resource material based on his particular needs.    Miya Luviano J. Allice Garro Care Manager [facility]  

## 2018-09-17 LAB — URINALYSIS
BILIRUBIN UA: NEGATIVE
GLUCOSE, UA: NEGATIVE
KETONES UA: NEGATIVE
Leukocytes, UA: NEGATIVE
NITRITE UA: NEGATIVE
Protein, UA: NEGATIVE
RBC, UA: NEGATIVE
Specific Gravity, UA: 1.023 (ref 1.005–1.030)
UUROB: 0.2 mg/dL (ref 0.2–1.0)
pH, UA: 5.5 (ref 5.0–7.5)

## 2018-09-21 ENCOUNTER — Other Ambulatory Visit: Payer: Self-pay

## 2018-09-29 ENCOUNTER — Other Ambulatory Visit: Payer: Self-pay

## 2018-09-29 ENCOUNTER — Emergency Department
Admission: EM | Admit: 2018-09-29 | Discharge: 2018-09-30 | Disposition: A | Discharge status: Home / Self Care | Payer: Worker's Compensation | Attending: Emergency Medicine | Admitting: Emergency Medicine

## 2018-09-29 ENCOUNTER — Encounter: Payer: Self-pay | Admitting: Emergency Medicine

## 2018-09-29 DIAGNOSIS — Z87891 Personal history of nicotine dependence: Secondary | ICD-10-CM | POA: Insufficient documentation

## 2018-09-29 DIAGNOSIS — W228XXA Striking against or struck by other objects, initial encounter: Secondary | ICD-10-CM | POA: Insufficient documentation

## 2018-09-29 DIAGNOSIS — Y999 Unspecified external cause status: Secondary | ICD-10-CM | POA: Diagnosis not present

## 2018-09-29 DIAGNOSIS — Y929 Unspecified place or not applicable: Secondary | ICD-10-CM | POA: Insufficient documentation

## 2018-09-29 DIAGNOSIS — Y939 Activity, unspecified: Secondary | ICD-10-CM | POA: Insufficient documentation

## 2018-09-29 DIAGNOSIS — S060X0A Concussion without loss of consciousness, initial encounter: Secondary | ICD-10-CM | POA: Diagnosis not present

## 2018-09-29 DIAGNOSIS — S060X9A Concussion with loss of consciousness of unspecified duration, initial encounter: Secondary | ICD-10-CM

## 2018-09-29 DIAGNOSIS — S0990XA Unspecified injury of head, initial encounter: Secondary | ICD-10-CM | POA: Diagnosis present

## 2018-09-29 NOTE — ED Triage Notes (Signed)
Patient ambulatory to triage with steady gait, without difficulty or distress noted; pt reports was walking at work ([employer] comp profile indicates UDS required), hit head on box; small lac noted to right eyebrow

## 2018-09-30 ENCOUNTER — Emergency Department: Payer: Worker's Compensation

## 2018-09-30 NOTE — ED Provider Notes (Signed)
[name] Emergency Department Provider Note _   First MD Initiated Contact with Patient 09/29/18 2355     (approximate)  I have reviewed the triage vital signs and the nursing notes.   HISTORY  Chief Complaint Laceration    HPI Gregory Woodward is a 33 y.o. male with below list of chronic medical conditions presents to the emergency department with history of accidentally striking the right side of his face/periorbital region against a fuse box at work 2 days ago.  Patient unsure if he lost consciousness however patient states that since the event he has had intermittent confusion headaches nausea difficulty with concentration.  Patient denies any weakness numbness visual changes or difficulty with ambulation.   Past Medical History:  Diagnosis Date  . Chronic abdominal pain   . Ulcerative colitis (HCC)     There are no active problems to display for this patient.   History reviewed. No pertinent surgical history.  Prior to Admission medications   Not on File    Allergies Onion  No family history on file.  Social History Social History   Tobacco Use  . Smoking status: Former Games developer  . Smokeless tobacco: Never Used  Substance Use Topics  . Alcohol use: No  . Drug use: No    Review of Systems Constitutional: No fever/chills Eyes: No visual changes. ENT: No sore throat. Cardiovascular: Denies chest pain. Respiratory: Denies shortness of breath. Gastrointestinal: No abdominal pain.  No nausea, no vomiting.  No diarrhea.  No constipation. Genitourinary: Negative for dysuria. Musculoskeletal: Negative for neck pain.  Negative for back pain. Integumentary: Negative for rash. Neurological: Positive for headache dizziness difficulty with concentration.   ____________________________________________   PHYSICAL EXAM:  VITAL SIGNS: ED Triage Vitals [09/29/18 2045]  Enc Vitals Group     BP (!) 131/96     Pulse Rate 77     Resp 18   Temp 98.3 F (36.8 C)     Temp src      SpO2 99 %     Weight 106.6 kg (235 lb)     Height 1.829 m (6')     Head Circumference      Peak Flow      Pain Score 8     Pain Loc      Pain Edu?      Excl. in GC?     Constitutional: Alert and oriented. Well appearing and in no acute distress. Eyes: Conjunctivae are normal. PERRL. EOMI. Head: Right periorbital ecchymoses with 1 cm healing superficial abrasion above the right brow. Mouth/Throat: Mucous membranes are moist.  Oropharynx non-erythematous. Neck: No stridor.  No cervical spine tenderness to palpation. Cardiovascular: Normal rate, regular rhythm. Good peripheral circulation. Grossly normal heart sounds. Respiratory: Normal respiratory effort.  No retractions. Lungs CTAB. Gastrointestinal: Soft and nontender. No distention.  Musculoskeletal: No lower extremity tenderness nor edema. No gross deformities of extremities. Neurologic:  Normal speech and language. No gross focal neurologic deficits are appreciated.  Skin:  Skin is warm, dry and intact. No rash noted. Psychiatric: Mood and affect are normal. Speech and behavior are normal.   Procedures   ____________________________________________   INITIAL IMPRESSION / ASSESSMENT AND PLAN / ED COURSE  As part of my medical decision making, I reviewed the following data within the electronic MEDICAL RECORD NUMBER 33 year old male presenting with above-stated history and physical exam following accidental head injury.  Concern for possible concussion .  CT scan of the head revealed no acute  intracranial findings.  Suspect concussion as the etiology for the patient symptoms.  Spoke with the patient at length regarding concussions. ____________________________________________  FINAL CLINICAL IMPRESSION(S) / ED DIAGNOSES  Final diagnoses:  Concussion with loss of consciousness, initial encounter     MEDICATIONS GIVEN DURING THIS VISIT:  Medications - No data to display   ED  Discharge Orders    None       Note:  This document was prepared using Dragon voice recognition software and may include unintentional dictation errors.    Darci CurrentBrown, [place] N, MD 09/30/18 [phone]

## 2019-09-02 DIAGNOSIS — L0390 Cellulitis, unspecified: Secondary | ICD-10-CM

## 2019-09-02 HISTORY — DX: Cellulitis, unspecified: L03.90

## 2019-10-02 ENCOUNTER — Other Ambulatory Visit: Payer: Self-pay

## 2019-10-02 ENCOUNTER — Encounter (HOSPITAL_BASED_OUTPATIENT_CLINIC_OR_DEPARTMENT_OTHER): Payer: Self-pay

## 2019-10-02 ENCOUNTER — Emergency Department (HOSPITAL_BASED_OUTPATIENT_CLINIC_OR_DEPARTMENT_OTHER): Payer: Self-pay

## 2019-10-02 ENCOUNTER — Emergency Department (HOSPITAL_BASED_OUTPATIENT_CLINIC_OR_DEPARTMENT_OTHER)
Admission: EM | Admit: 2019-10-02 | Discharge: 2019-10-02 | Disposition: A | Discharge status: Home / Self Care | Payer: Self-pay | Attending: Emergency Medicine | Admitting: Emergency Medicine

## 2019-10-02 DIAGNOSIS — Z87891 Personal history of nicotine dependence: Secondary | ICD-10-CM | POA: Insufficient documentation

## 2019-10-02 DIAGNOSIS — Z91018 Allergy to other foods: Secondary | ICD-10-CM | POA: Insufficient documentation

## 2019-10-02 DIAGNOSIS — L03115 Cellulitis of right lower limb: Secondary | ICD-10-CM | POA: Insufficient documentation

## 2019-10-02 MED ORDER — DOXYCYCLINE HYCLATE 100 MG PO CAPS: 100.0000 mg | ORAL_CAPSULE | Freq: Two times a day (BID) | ORAL | 0 refills | Status: DC

## 2019-10-02 MED ORDER — DOXYCYCLINE HYCLATE 100 MG PO TABS
100.0000 mg | ORAL_TABLET | Freq: Once | ORAL | Status: AC
  Administered 2019-10-02: 18:00:00 100 mg via ORAL
  Filled 2019-10-02: qty 1

## 2019-10-02 NOTE — Discharge Instructions (Signed)
Doxycycline 100mg  by mouth twice daily until the medicine is completely finished - this medicine is a strong antibiotic that treats certain infections.    Cellulitis is an infection of the skin that may continue to spread - so please take the medicine - as above - if you have worsening pain / redness / swelling / fever you should return to the ER immediately  Have your doctor recheck you in 48 hours - take pictures of your legs (like we did today) daily and share with your doctor.

## 2019-10-02 NOTE — ED Provider Notes (Signed)
[facility] EMERGENCY DEPARTMENT Provider Note   CSN: [id number] Arrival date & time: 10/02/19  1719     History Chief Complaint  Patient presents with  . Leg Pain    Gregory Woodward is a 34 y.o. male.  HPI   This patient is a 34 year old male, he denies any chronic medical conditions and states he does not take any daily medicines nor does he have any allergies.  He has not used illegal drugs in several months.  He reports that over the last week at some point he scratched a couple of pimples that he had on his leg and afterwards developed some spreading redness and swelling especially of his right lower leg.  He denies fevers chills nausea vomiting and has no coughing shortness of breath chest pain abdominal pain back pain diarrhea nausea vomiting sore throat headache blurred vision or any other symptoms.  He has minimal pain when she walks but is worried because the leg is becoming more red and swollen.  Denies history of blood clot.  He does report that he did fall on the stairs the other day when he was trying to catch his daughter who was falling but does not recall specifically injuring his leg  Past Medical History:  Diagnosis Date  . Chronic abdominal pain   . Ulcerative colitis (HCC)     There are no problems to display for this patient.   History reviewed. No pertinent surgical history.     No family history on file.  Social History   Tobacco Use  . Smoking status: Former Games developer  . Smokeless tobacco: Never Used  Substance Use Topics  . Alcohol use: No  . Drug use: Not Currently    Types: Marijuana    Home Medications Prior to Admission medications   Medication Sig Start Date End Date Taking? Authorizing Provider  doxycycline (VIBRAMYCIN) 100 MG capsule Take 1 capsule (100 mg total) by mouth 2 (two) times daily. 10/02/19   Eber Hong, MD    Allergies    Onion  Review of Systems   Review of Systems  All other systems reviewed and are  negative.   Physical Exam Updated Vital Signs BP 131/84 (BP Location: Right Arm)   Pulse 97   Temp 98.2 F (36.8 C) (Oral)   Resp 20   Ht 1.829 m (6')   Wt 103 kg   SpO2 98%   BMI 30.79 kg/m   Physical Exam Vitals and nursing note reviewed.  Constitutional:      General: He is not in acute distress.    Appearance: He is well-developed.  HENT:     Head: Normocephalic and atraumatic.     Mouth/Throat:     Pharynx: No oropharyngeal exudate.  Eyes:     General: No scleral icterus.       Right eye: No discharge.        Left eye: No discharge.     Conjunctiva/sclera: Conjunctivae normal.     Pupils: Pupils are equal, round, and reactive to light.  Neck:     Thyroid: No thyromegaly.     Vascular: No JVD.  Cardiovascular:     Rate and Rhythm: Normal rate and regular rhythm.     Heart sounds: Normal heart sounds. No murmur. No friction rub. No gallop.   Pulmonary:     Effort: Pulmonary effort is normal. No respiratory distress.     Breath sounds: Normal breath sounds. No wheezing or rales.  Abdominal:  General: Bowel sounds are normal. There is no distension.     Palpations: Abdomen is soft. There is no mass.     Tenderness: There is no abdominal tenderness.  Musculoskeletal:        General: No tenderness. Normal range of motion.     Cervical back: Normal range of motion and neck supple.     Right lower leg: Edema present.     Comments: There is tenderness to palpation over the right leg below the knee to the foot, there is mild edema in that area, there is redness anteriorly, no crepitance or subcutaneous emphysema  Lymphadenopathy:     Cervical: No cervical adenopathy.  Skin:    General: Skin is warm and dry.     Findings: Erythema present. No rash.  Neurological:     Mental Status: He is alert.     Coordination: Coordination normal.     Comments: Normal strength and sensation diffusely, normal gait  Psychiatric:        Behavior: Behavior normal.     ED  Results / Procedures / Treatments   Labs (all labs ordered are listed, but only abnormal results are displayed) Labs Reviewed - No data to display  EKG None  Radiology DG Tibia/Fibula Right  Result Date: 10/02/2019 CLINICAL DATA:  Pain EXAM: RIGHT TIBIA AND FIBULA - 2 VIEW COMPARISON:  None. FINDINGS: There is nonspecific right lower extremity soft tissue edema. There is no radiopaque foreign body. No radiographic evidence for osteomyelitis. There is no displaced fracture. No dislocation. IMPRESSION: 1. No acute osseous abnormality. 2. There is nonspecific lower extremity edema. Electronically Signed   By: Katherine Mantle M.D.   On: 10/02/2019 18:52    Procedures Procedures (including critical care time)  Medications Ordered in ED Medications  doxycycline (VIBRA-TABS) tablet 100 mg (100 mg Oral Given 10/02/19 1815)    ED Course  I have reviewed the triage vital signs and the nursing notes.  Pertinent labs & imaging results that were available during my care of the patient were reviewed by me and considered in my medical decision making (see chart for details).    MDM Rules/Calculators/A&P                      The patient's rash could be consistent with a cellulitis.  That being said the patient does have increased pains we will obtain a plain film to make sure he does not have any subcutaneous air.  It would be unusual to have a necrotizing fasciitis given his limited pain with walking and normal vital signs.  Will start with doxycycline, this seems to be more of a staph infection that it does I DVT, he has no risk factors for DVT.  The patient is agreeable to the plan.  X-ray shows no signs of subcutaneous emphysema, there is soft tissue edema which could be consistent with cellulitis. The patient is agreeable to discharge, stable at this time, vital signs reviewed, see below  Vitals:   10/02/19 1745 10/02/19 1747  BP:  131/84  Pulse:  97  Resp:  20  Temp:  98.2 F (36.8  C)  TempSrc:  Oral  SpO2:  98%  Weight: 103 kg   Height: 1.829 m (6')      Final Clinical Impression(s) / ED Diagnoses Final diagnoses:  Cellulitis of right lower extremity    Rx / DC Orders ED Discharge Orders         Ordered  doxycycline (VIBRAMYCIN) 100 MG capsule  2 times daily     10/02/19 1859           Noemi Chapel, MD 10/02/19 [phone]

## 2019-10-02 NOTE — ED Triage Notes (Signed)
Pt c/o pain and swelling to RLE x 2 days. Area of erythema and lesions noted during triage.

## 2019-11-27 ENCOUNTER — Ambulatory Visit (HOSPITAL_BASED_OUTPATIENT_CLINIC_OR_DEPARTMENT_OTHER)
Admission: RE | Admit: 2019-11-27 | Discharge: 2019-11-27 | Disposition: A | Discharge status: Home / Self Care | Payer: Self-pay | Source: Ambulatory Visit | Attending: Emergency Medicine | Admitting: Emergency Medicine

## 2019-11-27 ENCOUNTER — Emergency Department (HOSPITAL_BASED_OUTPATIENT_CLINIC_OR_DEPARTMENT_OTHER)
Admission: EM | Admit: 2019-11-27 | Discharge: 2019-11-27 | Disposition: A | Discharge status: Home / Self Care | Payer: Self-pay | Attending: Emergency Medicine | Admitting: Emergency Medicine

## 2019-11-27 ENCOUNTER — Encounter (HOSPITAL_BASED_OUTPATIENT_CLINIC_OR_DEPARTMENT_OTHER): Payer: Self-pay | Admitting: *Deleted

## 2019-11-27 ENCOUNTER — Other Ambulatory Visit: Payer: Self-pay

## 2019-11-27 DIAGNOSIS — M79661 Pain in right lower leg: Secondary | ICD-10-CM | POA: Insufficient documentation

## 2019-11-27 DIAGNOSIS — M79605 Pain in left leg: Secondary | ICD-10-CM | POA: Insufficient documentation

## 2019-11-27 DIAGNOSIS — M79662 Pain in left lower leg: Secondary | ICD-10-CM | POA: Insufficient documentation

## 2019-11-27 DIAGNOSIS — L03116 Cellulitis of left lower limb: Secondary | ICD-10-CM | POA: Insufficient documentation

## 2019-11-27 DIAGNOSIS — M79604 Pain in right leg: Secondary | ICD-10-CM | POA: Insufficient documentation

## 2019-11-27 DIAGNOSIS — L03115 Cellulitis of right lower limb: Secondary | ICD-10-CM | POA: Insufficient documentation

## 2019-11-27 DIAGNOSIS — R600 Localized edema: Secondary | ICD-10-CM | POA: Insufficient documentation

## 2019-11-27 LAB — CBC WITH DIFFERENTIAL/PLATELET
Abs Immature Granulocytes: 0.01 10*3/uL (ref 0.00–0.07)
Basophils Absolute: 0 10*3/uL (ref 0.0–0.1)
Basophils Relative: 1 %
Eosinophils Absolute: 0.5 10*3/uL (ref 0.0–0.5)
Eosinophils Relative: 8 %
HCT: 37.9 % — ABNORMAL LOW (ref 39.0–52.0)
Hemoglobin: 11.9 g/dL — ABNORMAL LOW (ref 13.0–17.0)
Immature Granulocytes: 0 %
Lymphocytes Relative: 20 %
Lymphs Abs: 1.3 10*3/uL (ref 0.7–4.0)
MCH: 25.2 pg — ABNORMAL LOW (ref 26.0–34.0)
MCHC: 31.4 g/dL (ref 30.0–36.0)
MCV: 80.3 fL (ref 80.0–100.0)
Monocytes Absolute: 0.5 10*3/uL (ref 0.1–1.0)
Monocytes Relative: 8 %
Neutro Abs: 4 10*3/uL (ref 1.7–7.7)
Neutrophils Relative %: 63 %
Platelets: 235 10*3/uL (ref 150–400)
RBC: 4.72 MIL/uL (ref 4.22–5.81)
RDW: 13.9 % (ref 11.5–15.5)
WBC: 6.3 10*3/uL (ref 4.0–10.5)
nRBC: 0 % (ref 0.0–0.2)

## 2019-11-27 LAB — BASIC METABOLIC PANEL
Anion gap: 7 (ref 5–15)
BUN: 12 mg/dL (ref 6–20)
CO2: 28 mmol/L (ref 22–32)
Calcium: 8.8 mg/dL — ABNORMAL LOW (ref 8.9–10.3)
Chloride: 102 mmol/L (ref 98–111)
Creatinine, Ser: 1.04 mg/dL (ref 0.61–1.24)
GFR calc Af Amer: >60 mL/min (ref >60)
GFR calc non Af Amer: >60 mL/min (ref >60)
Glucose, Bld: 106 mg/dL — ABNORMAL HIGH (ref 70–99)
Potassium: 4.3 mmol/L (ref 3.5–5.1)
Sodium: 137 mmol/L (ref 135–145)

## 2019-11-27 LAB — D-DIMER, QUANTITATIVE: D-Dimer, Quant: 0.56 ug/mL-FEU — ABNORMAL HIGH (ref 0.00–0.50)

## 2019-11-27 MED ORDER — LEVOFLOXACIN 500 MG PO TABS: 500.0000 mg | ORAL_TABLET | Freq: Every day | ORAL | 0 refills | Status: DC

## 2019-11-27 MED ORDER — LEVOFLOXACIN 500 MG PO TABS
500.0000 mg | ORAL_TABLET | Freq: Once | ORAL | Status: AC
  Administered 2019-11-27: 05:00:00 500 mg via ORAL
  Filled 2019-11-27: qty 1

## 2019-11-27 NOTE — ED Provider Notes (Signed)
I advised patient of his ultrasound results which showed no evidence of DVTs bilaterally.  He was given a referral to follow-up with dermatology and is going to try to call make an appointment tomorrow.   Rolan Bucco, MD 11/27/19 [phone]

## 2019-11-27 NOTE — ED Notes (Signed)
Pt aware that he will return for ultrasound today at 2pm.

## 2019-11-27 NOTE — ED Triage Notes (Signed)
Pt has been dealing with bilateral lower leg "cellulitis" for the past month. Has been treated with 2 different types of antibiotics with little improvements. Pt with no recent fevers. C/o swelling to bilateral lower legs and painful. Also, painful to ambulate. Pt with redness and swelling to bilateral lower extremities.

## 2019-11-27 NOTE — ED Notes (Signed)
Soda given 

## 2019-11-27 NOTE — ED Provider Notes (Signed)
[facility] Provider Note: Lowella Dell, MD, FACEP  CSN: [id number] MRN: [id number] ARRIVAL: 11/27/19 at 0109 ROOM: MH09/MH09   CHIEF COMPLAINT  Leg Pain   HISTORY OF PRESENT ILLNESS  11/27/19 3:07 AM Gregory Woodward is a 34 y.o. male with history of ulcerative colitis, currently in remission due to strict diet.  He has been having pain, swelling and erythema of the lower legs since January.  He was seen on 10/02/2019 and diagnosed with cellulitis of the right leg.  He was treated with doxycycline.  Symptoms improved but came back towards the end of February.  He was seen again on 10/31/2019 and placed on trimethoprim/sulfamethoxazole and Keflex which he has been taking erratically since (he was initially supposed to take a 10-day course but states he was taking the Keflex only twice a day instead of 4 times a day).  His symptoms have worsened over the past 2 days and his legs have increased erythema, pain and swelling.  He rates his pain is a 10 out of 10, worse with palpation or ambulation.  He has not had a fever.  Past Medical History:  Diagnosis Date  . Chronic abdominal pain   . Ulcerative colitis (HCC)     History reviewed. No pertinent surgical history.  No family history on file.  Social History   Tobacco Use  . Smoking status: Former Games developer  . Smokeless tobacco: Never Used  Substance Use Topics  . Alcohol use: No  . Drug use: Not Currently    Prior to Admission medications   Medication Sig Start Date End Date Taking? Authorizing Provider  levofloxacin (LEVAQUIN) 500 MG tablet Take 1 tablet (500 mg total) by mouth daily. 11/27/19   Sentoria Brent, MD    Allergies Onion   REVIEW OF SYSTEMS  Negative except as noted here or in the History of Present Illness.   PHYSICAL EXAMINATION  Initial Vital Signs Blood pressure 124/75, pulse 88, temperature 98.6 F (37 C), temperature source Oral, resp. rate 18, height 6' (1.829 m), weight 106.6  kg.  Examination General: Well-developed, well-nourished male in no acute distress; appearance consistent with age of record HENT: normocephalic; atraumatic Eyes: pupils equal, round and reactive to light; extraocular muscles intact Neck: supple Heart: regular rate and rhythm Lungs: clear to auscultation bilaterally Abdomen: soft; nondistended; nontender; bowel sounds present Extremities: No deformity; full range of motion; pulses normal; edema of bilateral lower extremities; swelling and erythema (both blanching and nonblanching (of bilateral lower extremities:      Neurologic: Awake, alert and oriented; motor function intact in all extremities and symmetric; no facial droop Skin: Warm and dry Psychiatric: Normal mood and affect   RESULTS  Summary of this visit's results, reviewed and interpreted by myself:   EKG Interpretation  Date/Time:    Ventricular Rate:    PR Interval:    QRS Duration:   QT Interval:    QTC Calculation:   R Axis:     Text Interpretation:        Laboratory Studies: Results for orders placed or performed during the hospital encounter of 11/27/19 (from the past 24 hour(s))  CBC with Differential/Platelet     Status: Abnormal   Collection Time: 11/27/19  4:00 AM  Result Value Ref Range   WBC 6.3 4.0 - 10.5 K/uL   RBC 4.72 4.22 - 5.81 MIL/uL   Hemoglobin 11.9 (L) 13.0 - 17.0 g/dL   HCT 46.2 (L) 70.3 - 50.0 %   MCV 80.3 80.0 -  100.0 fL   MCH 25.2 (L) 26.0 - 34.0 pg   MCHC 31.4 30.0 - 36.0 g/dL   RDW 13.9 11.5 - 15.5 %   Platelets 235 150 - 400 K/uL   nRBC 0.0 0.0 - 0.2 %   Neutrophils Relative % 63 %   Neutro Abs 4.0 1.7 - 7.7 K/uL   Lymphocytes Relative 20 %   Lymphs Abs 1.3 0.7 - 4.0 K/uL   Monocytes Relative 8 %   Monocytes Absolute 0.5 0.1 - 1.0 K/uL   Eosinophils Relative 8 %   Eosinophils Absolute 0.5 0.0 - 0.5 K/uL   Basophils Relative 1 %   Basophils Absolute 0.0 0.0 - 0.1 K/uL   Immature Granulocytes 0 %   Abs Immature  Granulocytes 0.01 0.00 - 0.07 K/uL  Basic metabolic panel     Status: Abnormal   Collection Time: 11/27/19  4:00 AM  Result Value Ref Range   Sodium 137 135 - 145 mmol/L   Potassium 4.3 3.5 - 5.1 mmol/L   Chloride 102 98 - 111 mmol/L   CO2 28 22 - 32 mmol/L   Glucose, Bld 106 (H) 70 - 99 mg/dL   BUN 12 6 - 20 mg/dL   Creatinine, Ser 1.04 0.61 - 1.24 mg/dL   Calcium 8.8 (L) 8.9 - 10.3 mg/dL   GFR calc non Af Amer >60 >60 mL/min   GFR calc Af Amer >60 >60 mL/min   Anion gap 7 5 - 15  D-dimer, quantitative (not at [facility])     Status: Abnormal   Collection Time: 11/27/19  4:00 AM  Result Value Ref Range   D-Dimer, Quant 0.56 (H) 0.00 - 0.50 ug/mL-FEU   Imaging Studies: No results found.  ED COURSE and MDM  Nursing notes, initial and subsequent vitals signs, including pulse oximetry, reviewed and interpreted by myself.  Vitals:   11/27/19 0140 11/27/19 0146 11/27/19 0359  BP:  124/75 128/82  Pulse:  88 67  Resp:  18 18  Temp:  98.6 F (37 C)   TempSrc:  Oral   SpO2:   100%  Weight: 106.6 kg    Height: 6' (1.829 m)     Medications  levofloxacin (LEVAQUIN) tablet 500 mg (has no administration in time range)    We will have the patient return for Doppler ultrasounds tomorrow given the somewhat chronic nature of his symptoms.  We will switch his antibiotics to Levaquin (not normally a first line drug for skin infections but he has failed current therapy) and referred to dermatology as there may be a vasculitic component.   PROCEDURES  Procedures   ED DIAGNOSES     ICD-10-CM   1. Cellulitis of left lower extremity  L03.116   2. Cellulitis of right lower extremity  L03.115        Shanon Rosser, MD 11/27/19 [phone]

## 2020-01-31 ENCOUNTER — Ambulatory Visit: Payer: Self-pay

## 2020-05-11 DIAGNOSIS — S46911A Strain of unspecified muscle, fascia and tendon at shoulder and upper arm level, right arm, initial encounter: Secondary | ICD-10-CM | POA: Insufficient documentation

## 2020-05-11 DIAGNOSIS — S29011A Strain of muscle and tendon of front wall of thorax, initial encounter: Secondary | ICD-10-CM | POA: Insufficient documentation

## 2020-05-11 DIAGNOSIS — Z79899 Other long term (current) drug therapy: Secondary | ICD-10-CM | POA: Diagnosis not present

## 2020-05-11 DIAGNOSIS — Y999 Unspecified external cause status: Secondary | ICD-10-CM | POA: Diagnosis not present

## 2020-05-11 DIAGNOSIS — Y92410 Unspecified street and highway as the place of occurrence of the external cause: Secondary | ICD-10-CM | POA: Diagnosis not present

## 2020-05-11 DIAGNOSIS — Z87891 Personal history of nicotine dependence: Secondary | ICD-10-CM | POA: Diagnosis not present

## 2020-05-11 DIAGNOSIS — Y93I9 Activity, other involving external motion: Secondary | ICD-10-CM | POA: Diagnosis not present

## 2020-05-11 DIAGNOSIS — M25511 Pain in right shoulder: Secondary | ICD-10-CM | POA: Diagnosis present

## 2020-05-12 ENCOUNTER — Encounter (HOSPITAL_BASED_OUTPATIENT_CLINIC_OR_DEPARTMENT_OTHER): Payer: Self-pay | Admitting: Emergency Medicine

## 2020-05-12 ENCOUNTER — Other Ambulatory Visit: Payer: Self-pay

## 2020-05-12 ENCOUNTER — Emergency Department (HOSPITAL_BASED_OUTPATIENT_CLINIC_OR_DEPARTMENT_OTHER)
Admission: EM | Admit: 2020-05-12 | Discharge: 2020-05-12 | Disposition: A | Discharge status: Home / Self Care | Payer: No Typology Code available for payment source | Attending: Emergency Medicine | Admitting: Emergency Medicine

## 2020-05-12 ENCOUNTER — Emergency Department (HOSPITAL_BASED_OUTPATIENT_CLINIC_OR_DEPARTMENT_OTHER): Payer: No Typology Code available for payment source

## 2020-05-12 DIAGNOSIS — T148XXA Other injury of unspecified body region, initial encounter: Secondary | ICD-10-CM

## 2020-05-12 MED ORDER — IBUPROFEN 800 MG PO TABS
800.0000 mg | ORAL_TABLET | Freq: Once | ORAL | Status: AC | PRN
  Administered 2020-05-12: 800 mg via ORAL
  Filled 2020-05-12: qty 1

## 2020-05-12 MED ORDER — NAPROXEN 375 MG PO TABS: 375.0000 mg | ORAL_TABLET | Freq: Two times a day (BID) | ORAL | 0 refills | Status: DC | PRN

## 2020-05-12 MED ORDER — KETOROLAC TROMETHAMINE 60 MG/2ML IM SOLN
30.0000 mg | Freq: Once | INTRAMUSCULAR | Status: AC
  Administered 2020-05-12: 30 mg via INTRAMUSCULAR
  Filled 2020-05-12: qty 2

## 2020-05-12 MED ORDER — METHOCARBAMOL 500 MG PO TABS: 500.0000 mg | ORAL_TABLET | Freq: Three times a day (TID) | ORAL | 0 refills | Status: DC | PRN

## 2020-05-12 MED ORDER — DEXAMETHASONE 4 MG PO TABS
8.0000 mg | ORAL_TABLET | Freq: Once | ORAL | Status: AC
  Administered 2020-05-12: 8 mg via ORAL
  Filled 2020-05-12: qty 2

## 2020-05-12 NOTE — ED Notes (Signed)
Pt discharged to home. Discharge instructions have been discussed with patient and/or family members. Pt verbally acknowledges understanding d/c instructions, and endorses comprehension to checkout at registration before leaving.  °

## 2020-05-12 NOTE — ED Notes (Signed)
ED Provider at bedside. 

## 2020-05-12 NOTE — ED Notes (Signed)
Patient called for vital sign recheck; no answer; not seen in lobby

## 2020-05-12 NOTE — ED Notes (Signed)
Pt ambulatory with steady gait to room. Pt was sleeping in lobby when called.
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
## 2020-05-12 NOTE — ED Notes (Signed)
Pharmacy and medications updated with patient 
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
## 2020-05-21 ENCOUNTER — Encounter (HOSPITAL_BASED_OUTPATIENT_CLINIC_OR_DEPARTMENT_OTHER): Payer: Self-pay | Admitting: *Deleted

## 2020-05-21 ENCOUNTER — Other Ambulatory Visit: Payer: Self-pay

## 2020-05-21 DIAGNOSIS — S060X9A Concussion with loss of consciousness of unspecified duration, initial encounter: Secondary | ICD-10-CM | POA: Diagnosis present

## 2020-05-21 DIAGNOSIS — Z87891 Personal history of nicotine dependence: Secondary | ICD-10-CM | POA: Diagnosis not present
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
## 2020-05-22 ENCOUNTER — Emergency Department (HOSPITAL_BASED_OUTPATIENT_CLINIC_OR_DEPARTMENT_OTHER): Payer: No Typology Code available for payment source

## 2020-05-22 ENCOUNTER — Emergency Department (HOSPITAL_BASED_OUTPATIENT_CLINIC_OR_DEPARTMENT_OTHER)
Admission: EM | Admit: 2020-05-22 | Discharge: 2020-05-22 | Disposition: A | Discharge status: Home / Self Care | Payer: No Typology Code available for payment source | Attending: Emergency Medicine | Admitting: Emergency Medicine

## 2020-05-22 DIAGNOSIS — F0781 Postconcussional syndrome: Secondary | ICD-10-CM

## 2020-05-22 MED ORDER — MECLIZINE HCL 25 MG PO TABS: 25.0000 mg | ORAL_TABLET | Freq: Three times a day (TID) | ORAL | 0 refills | Status: DC | PRN
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
## 2020-05-22 NOTE — ED Notes (Signed)
Patient transported to CT 

## 2020-06-21 ENCOUNTER — Encounter (HOSPITAL_BASED_OUTPATIENT_CLINIC_OR_DEPARTMENT_OTHER): Payer: Self-pay

## 2020-06-21 ENCOUNTER — Other Ambulatory Visit: Payer: Self-pay

## 2020-06-21 ENCOUNTER — Emergency Department (HOSPITAL_BASED_OUTPATIENT_CLINIC_OR_DEPARTMENT_OTHER)
Admission: EM | Admit: 2020-06-21 | Discharge: 2020-06-21 | Disposition: A | Discharge status: Home / Self Care | Payer: Self-pay | Attending: Emergency Medicine | Admitting: Emergency Medicine

## 2020-06-21 DIAGNOSIS — L03116 Cellulitis of left lower limb: Secondary | ICD-10-CM | POA: Insufficient documentation

## 2020-06-21 DIAGNOSIS — L03115 Cellulitis of right lower limb: Secondary | ICD-10-CM | POA: Insufficient documentation

## 2020-06-21 DIAGNOSIS — Z87891 Personal history of nicotine dependence: Secondary | ICD-10-CM | POA: Insufficient documentation

## 2020-06-21 DIAGNOSIS — L03119 Cellulitis of unspecified part of limb: Secondary | ICD-10-CM

## 2020-06-21 LAB — CBC
HCT: 37.6 % — ABNORMAL LOW (ref 39.0–52.0)
Hemoglobin: 11.6 g/dL — ABNORMAL LOW (ref 13.0–17.0)
MCH: 24.4 pg — ABNORMAL LOW (ref 26.0–34.0)
MCHC: 30.9 g/dL (ref 30.0–36.0)
MCV: 79.2 fL — ABNORMAL LOW (ref 80.0–100.0)
Platelets: 222 10*3/uL (ref 150–400)
RBC: 4.75 MIL/uL (ref 4.22–5.81)
RDW: 14.5 % (ref 11.5–15.5)
WBC: 5.4 10*3/uL (ref 4.0–10.5)
nRBC: 0 % (ref 0.0–0.2)

## 2020-06-21 LAB — BASIC METABOLIC PANEL
Anion gap: 4 — ABNORMAL LOW (ref 5–15)
BUN: 14 mg/dL (ref 6–20)
CO2: 31 mmol/L (ref 22–32)
Calcium: 9.5 mg/dL (ref 8.9–10.3)
Chloride: 103 mmol/L (ref 98–111)
Creatinine, Ser: 0.92 mg/dL (ref 0.61–1.24)
GFR, Estimated: >60 mL/min (ref >60)
Glucose, Bld: 103 mg/dL — ABNORMAL HIGH (ref 70–99)
Potassium: 4.2 mmol/L (ref 3.5–5.1)
Sodium: 138 mmol/L (ref 135–145)

## 2020-06-21 MED ORDER — DOXYCYCLINE HYCLATE 100 MG PO CAPS: 100.0000 mg | ORAL_CAPSULE | Freq: Two times a day (BID) | ORAL | 0 refills | Status: DC

## 2020-06-21 MED ORDER — IBUPROFEN 400 MG PO TABS
600.0000 mg | ORAL_TABLET | Freq: Once | ORAL | Status: AC
  Administered 2020-06-21: 600 mg via ORAL
  Filled 2020-06-21: qty 1

## 2020-06-21 NOTE — ED Triage Notes (Signed)
Pt c/o bilat LE swelling/pain x 2 days-pt reports same c/o ~7 months ago-states he was seem and given abx at that time-NAD-steady gait

## 2020-06-21 NOTE — ED Provider Notes (Signed)
[facility] EMERGENCY DEPARTMENT Provider Note   CSN: [id number] Arrival date & time: 06/21/20  1314     History Chief Complaint  Patient presents with  . Leg Swelling    Gregory Woodward is a 34 y.o. male with PMHx UC who presents to the ED today with complaint of gradual onset, constant, bilateral lower extremity swelling, pain, and redness for the past 3-4 days. Pt reports history of similar symptoms in the past with diagnosis of cellulitis. He denies any injury to his legs recently. No bites or scratches recently. No hx DVT/PE. Pt denies recent prolonged travel or immobilization. No chest pain or SOB. Denies fevers, chills, or any other associated symptoms.   The history is provided by the patient and medical records.       Past Medical History:  Diagnosis Date  . Chronic abdominal pain   . Ulcerative colitis (HCC)     There are no problems to display for this patient.   History reviewed. No pertinent surgical history.     No family history on file.  Social History   Tobacco Use  . Smoking status: Former Games developer  . Smokeless tobacco: Never Used  Vaping Use  . Vaping Use: Never used  Substance Use Topics  . Alcohol use: No  . Drug use: Not Currently    Home Medications Prior to Admission medications   Medication Sig Start Date End Date Taking? Authorizing Provider  doxycycline (VIBRAMYCIN) 100 MG capsule Take 1 capsule (100 mg total) by mouth 2 (two) times daily. 06/21/20   Tanda Rockers, PA-C  meclizine (ANTIVERT) 25 MG tablet Take 1 tablet (25 mg total) by mouth 3 (three) times daily as needed for dizziness. 05/22/20   Molpus, John, MD  methocarbamol (ROBAXIN) 500 MG tablet Take 1 tablet (500 mg total) by mouth every 8 (eight) hours as needed for muscle spasms. 05/12/20   Raeford Razor, MD  naproxen (NAPROSYN) 375 MG tablet Take 1 tablet (375 mg total) by mouth 2 (two) times daily as needed. 05/12/20   Raeford Razor, MD    Allergies     Onion  Review of Systems   Review of Systems  Constitutional: Negative for chills and fever.  Respiratory: Negative for shortness of breath.   Cardiovascular: Positive for leg swelling. Negative for chest pain.  Musculoskeletal: Positive for arthralgias.  Skin: Positive for color change (redness).  All other systems reviewed and are negative.   Physical Exam Updated Vital Signs BP 128/85 (BP Location: Left Arm)   Pulse (!) 103   Temp 98 F (36.7 C) (Oral)   Resp 20   Ht 6' (1.829 m)   Wt 113.4 kg   SpO2 97%   BMI 33.91 kg/m   Physical Exam Vitals and nursing note reviewed.  Constitutional:      Appearance: He is not ill-appearing.  HENT:     Head: Normocephalic and atraumatic.  Eyes:     Conjunctiva/sclera: Conjunctivae normal.  Cardiovascular:     Rate and Rhythm: Normal rate and regular rhythm.     Pulses: Normal pulses.  Pulmonary:     Effort: Pulmonary effort is normal.     Breath sounds: Normal breath sounds. No wheezing, rhonchi or rales.  Abdominal:     Palpations: Abdomen is soft.     Tenderness: There is no abdominal tenderness.  Musculoskeletal:     Cervical back: Neck supple.     Comments: See photos below. 1+ pitting edema bilaterally. Areas of erythema and increased  warmth to the touch to medial thighs bilaterally. + Diffuse TTP. ROM intact. 2+ DP pulses bilaterally.   Skin:    General: Skin is warm and dry.  Neurological:     Mental Status: He is alert.            ED Results / Procedures / Treatments   Labs (all labs ordered are listed, but only abnormal results are displayed) Labs Reviewed  CBC - Abnormal; Notable for the following components:      Result Value   Hemoglobin 11.6 (*)    HCT 37.6 (*)    MCV 79.2 (*)    MCH 24.4 (*)    All other components within normal limits  BASIC METABOLIC PANEL - Abnormal; Notable for the following components:   Glucose, Bld 103 (*)    Anion gap 4 (*)    All other components within normal  limits  CBC WITH DIFFERENTIAL/PLATELET    EKG None  Radiology No results found.  Procedures Procedures (including critical care time)  Medications Ordered in ED Medications  ibuprofen (ADVIL) tablet 600 mg (600 mg Oral Given 06/21/20 1422)    ED Course  I have reviewed the triage vital signs and the nursing notes.  Pertinent labs & imaging results that were available during my care of the patient were reviewed by me and considered in my medical decision making (see chart for details).    MDM Rules/Calculators/A&P                          34 year old male presenting to the ED today with complaint of BLE swelling, redness, and pain x 3-4 days. Hx of cellulitis and states this feels similar. On arrival to the ED pt is afebrile and nontachypneic. Mildly tachycardic in the low 100s. He is nontoxic appearing today. He has BLE 1+ pitting edema with erythema in patches and increased warmth to the touch suspicious for cellulitis. No hx of DVT and no risk factors for same today. Given bilaterality I have lower suspicion for this. Will plan for labs at this time and treat pt for cellulitis with abx. It appears pt was seen in the ED in Jan for same and treated with doxycycline with resolution. Returned in March to [facility] and was placed on Bactrim and Keflex however had only been sporadically taking it and returned to [facility] with no improvement and switched to levaquin. Pt does not have a PCP at this time however reports he is awaiting insurance from his job that should be at his house in the next couple of days. Have advised he find a PCP soon for follow up purposes.   Labwork reassuring. No leukocytosis today. Hgb stable. BMP without electrolyte abnormalities.   Will plan to treat for cellulitis today. Pt instructed to find PCP as soon as possible for follow up. Have instructed that he return to the ED for any worsening sx including worsening swelling, spreading of redness, fevers > 100.4, chest pain,  shortness of breath. HR has improved after iBuprofen; suspect elevated s/2 pain. Will discharge pt with doxycyline at this time as it did seem to help last time. Pt is in agreement with plan and stable for discharge.   This note was prepared using Dragon voice recognition software and may include unintentional dictation errors due to the inherent limitations of voice recognition software.  Final Clinical Impression(s) / ED Diagnoses Final diagnoses:  Cellulitis of lower extremity, unspecified laterality  Rx / DC Orders ED Discharge Orders         Ordered    doxycycline (VIBRAMYCIN) 100 MG capsule  2 times daily        06/21/20 1617           Discharge Instructions     Please pick up antibiotics and take as prescribed. It is important to take the entire course of antibiotics.   I would also recommend Ibuprofen and Tylenol as needed for pain. While at home please elevate your legs on 2-3 pillows to help with the swelling as well.   Once your insurance card comes in the mail please call to find a PCP that accepts your insurance for follow up purposes.   Return to the ED IMMEDIATELY for any worsening symptoms including worsening swelling, worsening pain, spreading of redness, fevers > 100.4, chest pain, shortness of breath, or any other new/concerning symptoms.        Tanda Rockers, PA-C 06/21/20 1619    Vanetta Mulders, MD 06/24/20 [phone]

## 2020-06-21 NOTE — Discharge Instructions (Signed)
Please pick up antibiotics and take as prescribed. It is important to take the entire course of antibiotics.   I would also recommend Ibuprofen and Tylenol as needed for pain. While at home please elevate your legs on 2-3 pillows to help with the swelling as well.   Once your insurance card comes in the mail please call to find a PCP that accepts your insurance for follow up purposes.   Return to the ED IMMEDIATELY for any worsening symptoms including worsening swelling, worsening pain, spreading of redness, fevers > 100.4, chest pain, shortness of breath, or any other new/concerning symptoms.
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
## 2020-08-03 ENCOUNTER — Observation Stay (HOSPITAL_BASED_OUTPATIENT_CLINIC_OR_DEPARTMENT_OTHER): Payer: Self-pay

## 2020-08-03 ENCOUNTER — Encounter (HOSPITAL_COMMUNITY): Payer: Self-pay | Admitting: Internal Medicine

## 2020-08-03 ENCOUNTER — Observation Stay (HOSPITAL_COMMUNITY)
Admission: EM | Admit: 2020-08-03 | Discharge: 2020-08-04 | Disposition: A | Discharge status: Home / Self Care | Payer: Self-pay | Attending: Internal Medicine | Admitting: Internal Medicine

## 2020-08-03 ENCOUNTER — Observation Stay (HOSPITAL_COMMUNITY): Payer: Self-pay

## 2020-08-03 ENCOUNTER — Emergency Department (HOSPITAL_BASED_OUTPATIENT_CLINIC_OR_DEPARTMENT_OTHER): Payer: Self-pay

## 2020-08-03 ENCOUNTER — Other Ambulatory Visit: Payer: Self-pay

## 2020-08-03 DIAGNOSIS — Z20822 Contact with and (suspected) exposure to covid-19: Secondary | ICD-10-CM | POA: Insufficient documentation

## 2020-08-03 DIAGNOSIS — L03119 Cellulitis of unspecified part of limb: Secondary | ICD-10-CM

## 2020-08-03 DIAGNOSIS — M79661 Pain in right lower leg: Secondary | ICD-10-CM

## 2020-08-03 DIAGNOSIS — Z87891 Personal history of nicotine dependence: Secondary | ICD-10-CM | POA: Insufficient documentation

## 2020-08-03 DIAGNOSIS — R609 Edema, unspecified: Secondary | ICD-10-CM

## 2020-08-03 DIAGNOSIS — R600 Localized edema: Secondary | ICD-10-CM

## 2020-08-03 DIAGNOSIS — L03116 Cellulitis of left lower limb: Secondary | ICD-10-CM | POA: Insufficient documentation

## 2020-08-03 DIAGNOSIS — L0390 Cellulitis, unspecified: Secondary | ICD-10-CM | POA: Diagnosis present

## 2020-08-03 DIAGNOSIS — L03115 Cellulitis of right lower limb: Principal | ICD-10-CM | POA: Insufficient documentation

## 2020-08-03 LAB — COMPREHENSIVE METABOLIC PANEL
ALT: 56 U/L — ABNORMAL HIGH (ref 0–44)
AST: 38 U/L (ref 15–41)
Albumin: 3.9 g/dL (ref 3.5–5.0)
Alkaline Phosphatase: 80 U/L (ref 38–126)
Anion gap: 13 (ref 5–15)
BUN: 9 mg/dL (ref 6–20)
CO2: 23 mmol/L (ref 22–32)
Calcium: 9.2 mg/dL (ref 8.9–10.3)
Chloride: 103 mmol/L (ref 98–111)
Creatinine, Ser: 0.87 mg/dL (ref 0.61–1.24)
GFR, Estimated: >60 mL/min (ref >60)
Glucose, Bld: 106 mg/dL — ABNORMAL HIGH (ref 70–99)
Potassium: 4 mmol/L (ref 3.5–5.1)
Sodium: 139 mmol/L (ref 135–145)
Total Bilirubin: 0.8 mg/dL (ref 0.3–1.2)
Total Protein: 7.4 g/dL (ref 6.5–8.1)

## 2020-08-03 LAB — CBC WITH DIFFERENTIAL/PLATELET
Abs Immature Granulocytes: 0.02 10*3/uL (ref 0.00–0.07)
Basophils Absolute: 0 10*3/uL (ref 0.0–0.1)
Basophils Relative: 1 %
Eosinophils Absolute: 0.6 10*3/uL — ABNORMAL HIGH (ref 0.0–0.5)
Eosinophils Relative: 12 %
HCT: 39.9 % (ref 39.0–52.0)
Hemoglobin: 12.6 g/dL — ABNORMAL LOW (ref 13.0–17.0)
Immature Granulocytes: 0 %
Lymphocytes Relative: 19 %
Lymphs Abs: 1 10*3/uL (ref 0.7–4.0)
MCH: 24.7 pg — ABNORMAL LOW (ref 26.0–34.0)
MCHC: 31.6 g/dL (ref 30.0–36.0)
MCV: 78.2 fL — ABNORMAL LOW (ref 80.0–100.0)
Monocytes Absolute: 0.5 10*3/uL (ref 0.1–1.0)
Monocytes Relative: 9 %
Neutro Abs: 3.1 10*3/uL (ref 1.7–7.7)
Neutrophils Relative %: 59 %
Platelets: 287 10*3/uL (ref 150–400)
RBC: 5.1 MIL/uL (ref 4.22–5.81)
RDW: 13.9 % (ref 11.5–15.5)
WBC: 5.2 10*3/uL (ref 4.0–10.5)
nRBC: 0 % (ref 0.0–0.2)

## 2020-08-03 LAB — LACTIC ACID, PLASMA: Lactic Acid, Venous: 1.4 mmol/L (ref 0.5–1.9)

## 2020-08-03 LAB — SEDIMENTATION RATE: Sed Rate: 7 mm/hr (ref 0–16)

## 2020-08-03 LAB — RESP PANEL BY RT-PCR (FLU A&B, COVID) ARPGX2
Influenza A by PCR: NEGATIVE
Influenza B by PCR: NEGATIVE
SARS Coronavirus 2 by RT PCR: NEGATIVE

## 2020-08-03 LAB — TSH: TSH: 2.202 u[IU]/mL (ref 0.350–4.500)

## 2020-08-03 LAB — HIV ANTIBODY (ROUTINE TESTING W REFLEX): HIV Screen 4th Generation wRfx: NONREACTIVE

## 2020-08-03 LAB — ECHOCARDIOGRAM COMPLETE
Area-P 1/2: 3.99 cm2
Height: 72 in
S' Lateral: 3.3 cm
Weight: 4320 oz

## 2020-08-03 LAB — C-REACTIVE PROTEIN: CRP: 6.3 mg/dL — ABNORMAL HIGH (ref <1.0)

## 2020-08-03 MED ORDER — VANCOMYCIN HCL 1250 MG/250ML IV SOLN
1250.0000 mg | Freq: Three times a day (TID) | INTRAVENOUS | Status: DC
  Filled 2020-08-03: qty 250

## 2020-08-03 MED ORDER — CEFAZOLIN SODIUM-DEXTROSE 2-4 GM/100ML-% IV SOLN
2.0000 g | Freq: Three times a day (TID) | INTRAVENOUS | Status: DC
  Administered 2020-08-03: 2 g via INTRAVENOUS
  Filled 2020-08-03 (×3): qty 100

## 2020-08-03 MED ORDER — ENOXAPARIN SODIUM 60 MG/0.6ML ~~LOC~~ SOLN
60.0000 mg | Freq: Every day | SUBCUTANEOUS | Status: DC
  Administered 2020-08-03 – 2020-08-04 (×2): 60 mg via SUBCUTANEOUS
  Filled 2020-08-03 (×3): qty 0.6

## 2020-08-03 MED ORDER — VANCOMYCIN HCL 10 G IV SOLR
2500.0000 mg | Freq: Once | INTRAVENOUS | Status: DC
  Filled 2020-08-03: qty 2500

## 2020-08-03 MED ORDER — HYDROCODONE-ACETAMINOPHEN 5-325 MG PO TABS
1.0000 | ORAL_TABLET | Freq: Four times a day (QID) | ORAL | Status: DC | PRN
  Administered 2020-08-03 – 2020-08-04 (×2): 1 via ORAL
  Filled 2020-08-03 (×2): qty 1

## 2020-08-03 MED ORDER — ACETAMINOPHEN 325 MG PO TABS
650.0000 mg | ORAL_TABLET | Freq: Four times a day (QID) | ORAL | Status: DC | PRN
  Administered 2020-08-03: 650 mg via ORAL
  Filled 2020-08-03: qty 2

## 2020-08-03 MED ORDER — PREDNISONE 20 MG PO TABS
20.0000 mg | ORAL_TABLET | Freq: Every day | ORAL | Status: DC
  Administered 2020-08-03: 20 mg via ORAL
  Filled 2020-08-03: qty 1

## 2020-08-03 NOTE — Progress Notes (Signed)
  Echocardiogram 2D Echocardiogram has been performed.  Delcie Roch 08/03/2020, 4:50 PM
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
## 2020-08-03 NOTE — Progress Notes (Signed)
Bilateral lower extremity venous duplex has been completed. Preliminary results can be found in CV Proc through chart review.  Results were given to [facility] PA.  08/03/20 12:10 PM Olen Cordial RVT

## 2020-08-03 NOTE — Progress Notes (Signed)
Pharmacy Antibiotic Note  Gregory Woodward is a 34 y.o. male admitted on 08/03/2020 with cellulitis and compartment syndrome.  Pharmacy has been consulted for vancomycin dosing.  SCr 0.87, CrCL > 100 ml/min, afebrile, WBC WNL, LA 1.4.  Plan: Vanc 2500mg  IV x 1, then 1250mg  IV Q8H for goal trough 10-15 mcg/mL Monitor renal fxn, clinical progress, vanc trough at Css  Height: 6' (182.9 cm) Weight: 122.5 kg (270 lb) IBW/kg (Calculated) : 77.6  Temp (24hrs), Avg:98.4 F (36.9 C), Min:98.4 F (36.9 C), Max:98.4 F (36.9 C)  Recent Labs  Lab 08/03/20 1040  WBC 5.2  CREATININE 0.87  LATICACIDVEN 1.4    Estimated Creatinine Clearance: 161.8 mL/min (by C-G formula based on SCr of 0.87 mg/dL).    Allergies  Allergen Reactions  . Onion Itching and Swelling     Doxy PTA Vanc 12/3 >>  12/3  BCx -   Julionna Marczak D. 14/3, PharmD, BCPS, BCCCP 08/03/2020, 12:29 PM

## 2020-08-03 NOTE — H&P (Signed)
Date: 08/03/2020               Patient Name:  Gregory Woodward MRN: [id number]  DOB: [birth year] Age / Sex: 34 y.o., male   PCP: Patient, No Pcp Per         Medical Service: Internal Medicine Teaching Service         Attending Physician: Dr. Velna Ochs, MD    First Contact: Dr. Demaris Callander Pager: [phone]  Second Contact: Dr. Charleen Kirks  Pager: [phone]       After Hours (After 5p/  First Contact Pager: [phone]  weekends / holidays): Second Contact Pager: [phone]   Chief Complaint: bilateral leg pain and swelling  History of Present Illness: Gregory Woodward is a 34 y/o gentleman with history of UC who presents with 3 month history of bilateral leg pain, swelling and redness. Symptoms improve with elevation, but have never fully resolved since September. He states that he initially had right leg pain, swelling and redness almost a year ago that improved with antibiotics. He has had three different antibiotic courses and states symptoms have never improved without antibiotics. He last had a course of Doxycycline one month ago with mild improvement.  He works as a Training and development officer at [employer], and it has become increasingly difficult for him to stand and work on his shift.  He is not on any medications and denies any environmental changes.  Patient denies fevers, chills, light headedness, headaches, URI symptoms, shortness of breath, chest pain, n/v, abdominal pain, changes in stools, changes in diet, urinary symptoms, hematuria, numbness/tingling in his legs.   Meds: no outpatient meds   Allergies: Allergies as of 08/03/2020 - Review Complete 08/03/2020  Allergen Reaction Noted  . Onion Itching and Swelling 03/19/2015   Past Medical History:  Diagnosis Date  . Chronic abdominal pain   . Ulcerative colitis ([place])     Family History: positive for DM and UC  Social History: works as a Training and development officer at [employer], lives at home with his sister. Denies tobacco, EtOH or illicit substance use  Review of  Systems: A complete ROS was negative except as per HPI.   Physical Exam: Blood pressure (!) 146/95, pulse 82, temperature 98.4 F (36.9 C), temperature source Oral, resp. rate 20, height 6' (1.829 m), weight 122.5 kg, SpO2 100 %. General: awake, alert, sitting up in bed in NAD HEENT: [place]/AT, sclera non-icteric, moist mucous membranes, oropharynx without erythema or exudates CV: RRR; no m/r/g Pulm: normal work of breathing; lungs CTAB Abd: BS+; abdomen soft, non-distended, non-tender  Neuro: A&Ox4; no focal deficits Ext: bilateral lower extremities with non-pitting edema, erythema, and warmth from ankle to just above the knees  Skin: no nodules, blanching spots   Assessment & Plan by Problem: Active Problems:   Cellulitis  Mr. Wamser is a 34 y/o gentleman with history of UC who presents with 3 month history of persistent bilateral lower extremity warmth, erythema and swelling despite intermittent PO antibiotics. He was admitted for further work-up and IV antibiotics for possible cellulitis.   Bilateral lower extremity edema, erythema  -patient has had symptoms intermittently for 9 months prior to persistent symptoms the last 3 months -seems infection is likely given that it is bilateral and previous courses haven't made much improvement, but will continue IV antibiotics for now given that there is no other obvious cause. Since he does not have any purulence, will transition to Ancef.  -lower extremity u/s negative for DVT -will check ESR and CRP  to see if there are signs of underlying systemic inflammation, could indicate atypical presentation of erythema nodosum  -will also check TSH to rule out thyroid disease leading to pretibial myxedema -he does not have any other symptoms he heart failure, but will obtain TTE to work-up other causes of BLE edema, as well as a U/A to screen for glomerular disease   DVT ppx: Lovenox Diet: Regular CODE: FULL   Dispo: Admit patient to Observation with  expected length of stay less than 2 midnights.  SignedDelice Bison, DO 08/03/2020, 1:40 PM  Pager: [phone] After 5pm on weekdays and 1pm on weekends: On Call pager: [phone]

## 2020-08-04 LAB — BASIC METABOLIC PANEL
Anion gap: 10 (ref 5–15)
BUN: 11 mg/dL (ref 6–20)
CO2: 25 mmol/L (ref 22–32)
Calcium: 9.4 mg/dL (ref 8.9–10.3)
Chloride: 104 mmol/L (ref 98–111)
Creatinine, Ser: 0.95 mg/dL (ref 0.61–1.24)
GFR, Estimated: >60 mL/min (ref >60)
Glucose, Bld: 124 mg/dL — ABNORMAL HIGH (ref 70–99)
Potassium: 4.3 mmol/L (ref 3.5–5.1)
Sodium: 139 mmol/L (ref 135–145)

## 2020-08-04 LAB — CBC
HCT: 38 % — ABNORMAL LOW (ref 39.0–52.0)
Hemoglobin: 11.8 g/dL — ABNORMAL LOW (ref 13.0–17.0)
MCH: 24 pg — ABNORMAL LOW (ref 26.0–34.0)
MCHC: 31.1 g/dL (ref 30.0–36.0)
MCV: 77.4 fL — ABNORMAL LOW (ref 80.0–100.0)
Platelets: 283 10*3/uL (ref 150–400)
RBC: 4.91 MIL/uL (ref 4.22–5.81)
RDW: 13.7 % (ref 11.5–15.5)
WBC: 4.9 10*3/uL (ref 4.0–10.5)
nRBC: 0 % (ref 0.0–0.2)

## 2020-08-04 MED ORDER — PREDNISONE 20 MG PO TABS
20.0000 mg | ORAL_TABLET | Freq: Every day | ORAL | 0 refills | Status: DC
Start: 2020-08-05 — End: 2021-02-21

## 2020-08-04 NOTE — Progress Notes (Signed)
Subjective:   Gregory Woodward states that he his legs are feeling significantly better today and he is very excited about this.  He states that they feel " flabby" and this is a good thing.  He notes that usually his legs are very taut and painful for the past several months.  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  Per Dr. Antony Contras, patient stated he continues to have blood in his stool daily, however he has not had any work-up for his UC since he was diagnosed 2 years ago.  He changed his diet but never started any medications.  He is interested in establishing with GI, however he does not have insurance as of yet.  Objective:  Vital signs in last 24 hours: Vitals:   08/03/20 1615 08/03/20 1717 08/03/20 2354 08/04/20 0503  BP: 131/87 (!) 145/102 127/76 115/77  Pulse: 74 66 82 93  Resp: 12 14 16 17   Temp: 98.5 F (36.9 C)  98.3 F (36.8 C) 97.8 F (36.6 C)  TempSrc:   Oral Oral  SpO2: 100% 95% 97% 96%  Weight:      Height:       Physical Exam Vitals and nursing note reviewed.  Constitutional:      General: He is not in acute distress.    Appearance: He is obese.  Cardiovascular:     Rate and Rhythm: Normal rate and regular rhythm.     Pulses: Normal pulses.     Heart sounds: No murmur heard.  No gallop.   Pulmonary:     Effort: Pulmonary effort is normal. No respiratory distress.     Breath sounds: No wheezing, rhonchi or rales.  Abdominal:     General: Bowel sounds are normal. There is no distension.     Palpations: Abdomen is soft.     Tenderness: There is no abdominal tenderness. There is no guarding.  Musculoskeletal:     Right lower leg: Edema (non pitting) present.     Left lower leg: Edema (non pitting) present.  Skin:    General: Skin is warm and dry.     Comments: Taut, shiny skin that has some wrinkles, showing improvement in swelling. No erythema,  petechiae or other lesions noted.   Neurological:     General: No focal deficit present.     Mental Status: He is alert and oriented to person, place, and time. Mental status is at baseline.  Psychiatric:        Mood and Affect: Mood normal.        Behavior: Behavior normal.        Thought Content: Thought content normal.        Judgment: Judgment normal.    Assessment/Plan:  Active Problems:   Cellulitis  Gregory Woodward is a 34 y/o gentleman with history of UC who presents with 3 month history of persistent bilateral lower extremity warmth, erythema and swelling despite intermittent PO antibiotics.    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  Given patient has a history of autoimmune disorder that is currently not well controlled, there is a high chance that this is an atypical presentation of erythema nodosum.  He has failed conservative management given recurrence in the past several months.  Since initiating prednisone, he has had already improvement  in symptoms, further supporting diagnosis of EN.  Will discharge with a 8-day dose of prednisone to complete a 10-day course and recommend follow-up in the clinic to set up referral to GI.  -Prednisone 20 mg daily x8 days to complete 10-day course -PCP follow-up -Will need GI follow-up  # Microcytic Anemia  Likely iron deficiency anemia due to chronic blood loss in the setting of uncontrolled UC.  -Will recommend patient start iron supplementation on outpatient follow-up  # Ulcerative Colitis  Diagnosed 2 years ago. Will need a colonoscopy and outpatient follow up.   Prior to Admission Living Arrangement: Home  Anticipated Discharge Location: Home  Barriers to Discharge: None  Dispo: Anticipated discharge today   Dr. Verdene Lennert Internal Medicine PGY-2  Pager: [phone] After 5pm on  weekdays and 1pm on weekends: On Call pager [phone]  08/04/2020, 7:41 AM

## 2020-08-04 NOTE — Progress Notes (Signed)
Discharge instructions reviewed with patient and sister. Information provided. Transportation provided by family.

## 2020-08-04 NOTE — Discharge Summary (Addendum)
Name: Gregory Woodward MRN: [id number] DOB: [birth year] 34 y.o. PCP: Patient, No Pcp Per  Date of Admission: 08/03/2020 10:22 AM Date of Discharge: 08/04/2020 Attending Physician: Dr. Philipp Ovens  Discharge Diagnosis: 1. Erythema Nodosum 2. Microcytic Anemia 2/2 chronic blood loss  3. Hx of Ulcerative Colitis   Discharge Medications: Allergies as of 08/04/2020       Reactions   Onion Itching, Swelling        Medication List     STOP taking these medications    doxycycline 100 MG capsule Commonly known as: VIBRAMYCIN   meclizine 25 MG tablet Commonly known as: ANTIVERT   methocarbamol 500 MG tablet Commonly known as: ROBAXIN       TAKE these medications    naproxen 375 MG tablet Commonly known as: NAPROSYN Take 1 tablet (375 mg total) by mouth 2 (two) times daily as needed.   predniSONE 20 MG tablet Commonly known as: DELTASONE Take 1 tablet (20 mg total) by mouth daily with breakfast.        Disposition and follow-up:   Gregory Woodward was discharged from [facility] in Good condition.  At the hospital follow up visit please address:  1.    - Evaluate that patient was able to complete the Prednisone course and symptoms resolved.   - Patient is overdue for GI follow up and colonoscopy   - Iron panel and ferritin to be evaluated, as would benefit from iron supplementation (oral vs infusion)   2.  Labs / imaging needed at time of follow-up: CBC, Ferritin, iron panel   3.  Pending labs/ test needing follow-up: None   Follow-up Appointments:  Follow-up Information     [name], MD Follow up.   Specialty: Internal Medicine Contact information: [address] [place] [address] [place] Hospital Course by problem list:  1. 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 He had multiple prior  episodes which were treated as "cellulitis".  This was his 4th episode this year. Initial work-up for bilateral lower extremity swelling demonstrated no evidence of kidney disease, heart failure, DVT.  CRP was elevated at 6.3, ESR WNL. Given that his episodes are recurrent and bilateral, in the absence of fever and leukocytosis infectious was felt to be unlikely. Antibiotics were discontinued on admission and he was started on oral prednisone. He had rapid improvement of his symptoms the following day. He was discharged with a prescription to complete a 10-day course and recommend follow-up in the clinic to set up referral to GI. Patient was informed that this is likely from his untreated UC.   2. Microcytic Anemia 2/2 chronic blood loss  Patient endorses chronic hx of bright red blood per rectum, likely secondary to his diagnosis of UC. He is not on any iron supplementation at this time.   3. Hx of Ulcerative Colitis  Patient states he was diagnosed two years ago and has never followed up with a GI provider since. He continues to report bloody BMs. He was diagnosed at an OSH with a colonoscopy but cannot recall where and there are no records in care everywhere. He will need a GI referral, please follow up.   Discharge Vitals:   BP 128/74 (BP Location: Right Arm)    Pulse 91    Temp  98.4 F (36.9 C) (Oral)    Resp 16    Ht 6' (1.829 m)    Wt 122.5 kg    SpO2 100%    BMI 36.62 kg/m   Pertinent Labs, Studies, and Procedures:   CBC Latest Ref Rng & Units 08/04/2020 08/03/2020 06/21/2020  WBC 4.0 - 10.5 K/uL 4.9 5.2 5.4  Hemoglobin 13.0 - 17.0 g/dL 11.8(L) 12.6(L) 11.6(L)  Hematocrit 39 - 52 % 38.0(L) 39.9 37.6(L)  Platelets 150 - 400 K/uL 283 287 222   CMP Latest Ref Rng & Units 08/04/2020 08/03/2020 06/21/2020  Glucose 70 - 99 mg/dL 124(H) 106(H) 103(H)  BUN 6 - 20 mg/dL _0 Creatinine 0.61 - 1.24 mg/dL 0.95 0.87 0.92  Sodium 135 - 145 mmol/L 139 139 138  Potassium 3.5 - 5.1 mmol/L 4.3 4.0  4.2  Chloride 98 - 111 mmol/L 104 103 103  CO2 22 - 32 mmol/L _1 Calcium 8.9 - 10.3 mg/dL 9.4 9.2 9.5  Total Protein 6.5 - 8.1 g/dL - 7.4 -  Total Bilirubin 0.3 - 1.2 mg/dL - 0.8 -  Alkaline Phos 38 - 126 U/L - 80 -  AST 15 - 41 U/L - 38 -  ALT 0 - 44 U/L - 56(H) -   Lab Results  Component Value Date   TSH 2.202 08/03/2020   Lab Results  Component Value Date   CRP 6.3 (H) 08/03/2020   Lab Results  Component Value Date   ESRSEDRATE 7 08/03/2020   Bilateral venous duplex (08/03/2020)  There is no evidence of deep vein thrombosis  TTE (08/03/2020)   1. Left ventricular ejection fraction, by estimation, is 65 to 70%. The  left ventricle has normal function. The left ventricle has no regional  wall motion abnormalities. Left ventricular diastolic parameters were  normal.   2. Right ventricular systolic function is normal. The right ventricular  size is normal.   3. The mitral valve is normal in structure. Trivial mitral valve  regurgitation. No evidence of mitral stenosis.   4. The aortic valve is normal in structure. Aortic valve regurgitation is  not visualized. No aortic stenosis is present.   Discharge Instructions: Discharge Instructions     Call MD for:  difficulty breathing, headache or visual disturbances   Complete by: As directed    Call MD for:  extreme fatigue   Complete by: As directed    Call MD for:  persistant dizziness or light-headedness   Complete by: As directed    Call MD for:  persistant nausea and vomiting   Complete by: As directed    Call MD for:  redness, tenderness, or signs of infection (pain, swelling, redness, odor or green/yellow discharge around incision site)   Complete by: As directed    Call MD for:  severe uncontrolled pain   Complete by: As directed    Call MD for:  temperature >100.4   Complete by: As directed    Discharge instructions   Complete by: As directed    [name], [name] were admitted to the hospital due to your  leg swelling and pain.  While here, we did a work-up that revealed no blood clots, heart problems, or kidney problems.  We feel that your swelling is most likely secondary to erythema nodosum, that can happen with people who have other autoimmune disorders, such as your ulcerative colitis.  For treatment, we started you on a 10-day course of prednisone.  You have  received 2 doses in the hospital, and we have sent an additional 8 tablets to your pharmacy.  You do not need to do a taper, but just take the 20 mg daily with breakfast.  It will be very important in the long run to control your ulcerative colitis.  We would love to see you in our clinic that is located in the basement of the hospital for continued work-up and treatment.  I will have our front desk give you a call to schedule an appointment.  If you would prefer to call the clinic yourself, the number is [phone].  It was a pleasure participating in your care.  - Dr. Charleen Kirks   Increase activity slowly   Complete by: As directed        Signed: Dr. [name] Internal Medicine PGY-2  Pager: [phone] After 5pm on weekdays and 1pm on weekends: On Call pager [phone]  08/07/2020, 1:40 PM

## 2020-08-04 NOTE — Plan of Care (Signed)
  Problem: Clinical Measurements: Goal: Ability to avoid or minimize complications of infection will improve Outcome: Completed/Met

## 2020-08-08 LAB — CULTURE, BLOOD (ROUTINE X 2)
Culture: NO GROWTH
Culture: NO GROWTH
Special Requests: ADEQUATE

## 2020-09-10 ENCOUNTER — Telehealth: Payer: Self-pay | Admitting: Pharmacy Technician

## 2020-09-10 NOTE — Telephone Encounter (Signed)
Patient failed to provide 2021 proof of income.  No additional medication assistance will be provided by [facility] without the required proof of income documentation.  Patient notified by letter.  Gregory Woodward J. Gregory Woodward Care Manager [facility]   [address] [place],   [address]     This is to inform you that you are no longer eligible to receive medication assistance at [facility].  The reason(s) are:    _____Your total gross monthly household income exceeds 250% of the Federal Poverty Level.   _____Tangible assets (savings, checking, stocks/bonds, pension, retirement, etc.) exceeds our limit  _____You are eligible to receive benefits from Medicaid, [facility] or HIV Medication              Assistance Program _____You are eligible to receive benefits from a Medicare Part "D" plan _____You have prescription insurance  _____You are not an [place] resident __X__Failure to provide all requested proof of income information for 2021.    Medication assistance will resume once all requested financial information has been returned to our clinic.  If you have questions, please contact our clinic at [phone].    Thank you,  [facility] 

## 2021-02-21 ENCOUNTER — Ambulatory Visit (HOSPITAL_COMMUNITY)
Admission: EM | Admit: 2021-02-21 | Discharge: 2021-02-21 | Disposition: A | Discharge status: Home / Self Care | Payer: Managed Care, Other (non HMO) | Attending: Physician Assistant | Admitting: Physician Assistant

## 2021-02-21 ENCOUNTER — Encounter (HOSPITAL_COMMUNITY): Payer: Self-pay | Admitting: Emergency Medicine

## 2021-02-21 ENCOUNTER — Other Ambulatory Visit: Payer: Self-pay

## 2021-02-21 DIAGNOSIS — L03211 Cellulitis of face: Secondary | ICD-10-CM | POA: Insufficient documentation

## 2021-02-21 DIAGNOSIS — L0201 Cutaneous abscess of face: Secondary | ICD-10-CM | POA: Diagnosis not present

## 2021-02-21 LAB — COMPREHENSIVE METABOLIC PANEL
ALT: 40 U/L (ref 0–44)
AST: 27 U/L (ref 15–41)
Albumin: 3.9 g/dL (ref 3.5–5.0)
Alkaline Phosphatase: 92 U/L (ref 38–126)
Anion gap: 8 (ref 5–15)
BUN: 7 mg/dL (ref 6–20)
CO2: 27 mmol/L (ref 22–32)
Calcium: 9.3 mg/dL (ref 8.9–10.3)
Chloride: 102 mmol/L (ref 98–111)
Creatinine, Ser: 0.85 mg/dL (ref 0.61–1.24)
GFR, Estimated: >60 mL/min (ref >60)
Glucose, Bld: 116 mg/dL — ABNORMAL HIGH (ref 70–99)
Potassium: 3.7 mmol/L (ref 3.5–5.1)
Sodium: 137 mmol/L (ref 135–145)
Total Bilirubin: 0.9 mg/dL (ref 0.3–1.2)
Total Protein: 7.6 g/dL (ref 6.5–8.1)

## 2021-02-21 LAB — CBC WITH DIFFERENTIAL/PLATELET
Abs Immature Granulocytes: 0.05 10*3/uL (ref 0.00–0.07)
Basophils Absolute: 0 10*3/uL (ref 0.0–0.1)
Basophils Relative: 0 %
Eosinophils Absolute: 0.3 10*3/uL (ref 0.0–0.5)
Eosinophils Relative: 3 %
HCT: 39.7 % (ref 39.0–52.0)
Hemoglobin: 12.5 g/dL — ABNORMAL LOW (ref 13.0–17.0)
Immature Granulocytes: 1 %
Lymphocytes Relative: 14 %
Lymphs Abs: 1.5 10*3/uL (ref 0.7–4.0)
MCH: 24.1 pg — ABNORMAL LOW (ref 26.0–34.0)
MCHC: 31.5 g/dL (ref 30.0–36.0)
MCV: 76.5 fL — ABNORMAL LOW (ref 80.0–100.0)
Monocytes Absolute: 1 10*3/uL (ref 0.1–1.0)
Monocytes Relative: 9 %
Neutro Abs: 7.9 10*3/uL — ABNORMAL HIGH (ref 1.7–7.7)
Neutrophils Relative %: 73 %
Platelets: 273 10*3/uL (ref 150–400)
RBC: 5.19 MIL/uL (ref 4.22–5.81)
RDW: 14.9 % (ref 11.5–15.5)
WBC: 10.7 10*3/uL — ABNORMAL HIGH (ref 4.0–10.5)
nRBC: 0 % (ref 0.0–0.2)

## 2021-02-21 MED ORDER — CEFTRIAXONE SODIUM 1 G IJ SOLR
INTRAMUSCULAR | Status: AC
  Filled 2021-02-21: qty 10

## 2021-02-21 MED ORDER — DOXYCYCLINE HYCLATE 100 MG PO CAPS: 100.0000 mg | ORAL_CAPSULE | Freq: Two times a day (BID) | ORAL | 0 refills | Status: DC

## 2021-02-21 MED ORDER — LIDOCAINE HCL (PF) 1 % IJ SOLN
INTRAMUSCULAR | Status: AC
  Filled 2021-02-21: qty 2

## 2021-02-21 MED ORDER — MUPIROCIN 2 % EX OINT: 1.0000 "application " | TOPICAL_OINTMENT | Freq: Two times a day (BID) | CUTANEOUS | 0 refills | Status: DC

## 2021-02-21 MED ORDER — CEFTRIAXONE SODIUM 1 G IJ SOLR
1.0000 g | Freq: Once | INTRAMUSCULAR | Status: AC
  Administered 2021-02-21: 1 g via INTRAMUSCULAR

## 2021-02-21 NOTE — ED Triage Notes (Addendum)
Patient c/o facial swelling that started yesterday.  Patient states " I woke up and it felt like I had a white head and then I popped it and pulled out the hair, then later on it got swollen".   Patient endorses greenish discharge from site.   Patient has swelling to LFT side of face upon assessment. Patient has erythema present to LFT side of face.   Patient used Vix vapor rub with no relief of symptoms.

## 2021-02-21 NOTE — ED Provider Notes (Addendum)
[facility]    CSN: [id number] Arrival date & time: 02/21/21  1634      History   Chief Complaint Chief Complaint  Patient presents with   Facial Swelling    HPI Gregory Woodward is a 35 y.o. male.   Patient presents today with a 2-day history of swelling and erythema of left face.  Reports attempting to drain lesion and being able to express purulent discharge.  He reports pain is rated 10 on a 0-10 pain scale, localized to affected area, described as throbbing, no aggravating or alleviating factors identified.  He was given pain medication by his girlfriend that has provided some relief of symptoms.  He has applied Vicks VapoRub at the recommendation of his family that is provided no relief of symptoms.  He does have a history of cellulitis but denies history of recurrent skin infections.  He denies history of diabetes or immunosuppression.  He denies history of MRSA.  He denies any recent antibiotic use.  Denies additional symptoms including fever, nausea, vomiting, swelling of mouth or tongue, dysphagia, muffled voice, odynophagia.   Past Medical History:  Diagnosis Date   Cellulitis 2021   Chronic abdominal pain    Ulcerative colitis [facility])     Patient Active Problem List   Diagnosis Date Noted   Cellulitis 08/03/2020    History reviewed. No pertinent surgical history.     Home Medications    Prior to Admission medications   Medication Sig Start Date End Date Taking? Authorizing Provider  doxycycline (VIBRAMYCIN) 100 MG capsule Take 1 capsule (100 mg total) by mouth 2 (two) times daily. 02/21/21  Yes Bently Wyss, Denny Peon K, PA-C  mupirocin ointment (BACTROBAN) 2 % Apply 1 application topically 2 (two) times daily. 02/21/21  Yes Antwion Carpenter, Noberto Retort, PA-C    Family History History reviewed. No pertinent family history.  Social History Social History   Tobacco Use   Smoking status: Former    Pack years: 0.00   Smokeless tobacco: Never  Vaping Use   Vaping Use: Never  used  Substance Use Topics   Alcohol use: No   Drug use: Not Currently     Allergies   Onion   Review of Systems Review of Systems  Constitutional:  Negative for activity change, appetite change, fatigue and fever.  HENT:  Positive for facial swelling.   Respiratory:  Negative for cough and shortness of breath.   Cardiovascular:  Negative for chest pain.  Gastrointestinal:  Negative for abdominal pain, diarrhea, nausea and vomiting.  Musculoskeletal:  Negative for arthralgias and myalgias.  Skin:  Positive for color change and wound.  Neurological:  Negative for dizziness, light-headedness and headaches.    Physical Exam Triage Vital Signs ED Triage Vitals  Enc Vitals Group     BP 02/21/21 1746 130/83     Pulse Rate 02/21/21 1746 99     Resp 02/21/21 1746 13     Temp 02/21/21 1746 98.8 F (37.1 C)     Temp Source 02/21/21 1746 Oral     SpO2 02/21/21 1746 98 %     Weight --      Height --      Head Circumference --      Peak Flow --      Pain Score 02/21/21 1744 10     Pain Loc --      Pain Edu? --      Excl. in GC? --    No data found.  Updated Vital  Signs BP 130/83 (BP Location: Left Arm)   Pulse 99   Temp 98.8 F (37.1 C) (Oral)   Resp 13   SpO2 98%   Visual Acuity Right Eye Distance:   Left Eye Distance:   Bilateral Distance:    Right Eye Near:   Left Eye Near:    Bilateral Near:     Physical Exam Vitals reviewed.  Constitutional:      General: He is awake.     Appearance: Normal appearance. He is normal weight. He is not ill-appearing.     Comments: Pleasant male appears stated age in no acute distress  HENT:     Head: Normocephalic and atraumatic.      Comments: 3 cm by 5 cm area of erythema and induration with central ulcerated lesion noted left face.  Trace purulent drainage noted.  No streaking or evidence of lymphangitis.  No significant fluctuance noted.    Mouth/Throat:     Pharynx: No oropharyngeal exudate, posterior oropharyngeal  erythema or uvula swelling.  Cardiovascular:     Rate and Rhythm: Normal rate and regular rhythm.     Heart sounds: Normal heart sounds, S1 normal and S2 normal. No murmur heard. Pulmonary:     Effort: Pulmonary effort is normal.     Breath sounds: Normal breath sounds. No stridor. No wheezing, rhonchi or rales.     Comments: Clear to auscultation bilaterally Skin:    Findings: Abscess and erythema present.  Neurological:     Mental Status: He is alert.  Psychiatric:        Behavior: Behavior is cooperative.     UC Treatments / Results  Labs (all labs ordered are listed, but only abnormal results are displayed) Labs Reviewed - No data to display  EKG   Radiology No results found.  Procedures Procedures (including critical care time)  Medications Ordered in UC Medications  cefTRIAXone (ROCEPHIN) injection 1 g (has no administration in time range)    Initial Impression / Assessment and Plan / UC Course  I have reviewed the triage vital signs and the nursing notes.  Pertinent labs & imaging results that were available during my care of the patient were reviewed by me and considered in my medical decision making (see chart for details).      I&D was not performed given location of abscess and since this is actively draining.  Patient was given 1 g of Rocephin in clinic and started on doxycycline 100 mg twice daily for 10 days prescription to avoid sun exposure on this medication due to sensitivity associated with medicine.  CBC and CMP obtained today-results pending.  If patient has significant leukocytosis he will need to go to the emergency room.  He was encouraged warm compresses multiple times per day.  He was prescribed Bactroban to be used lesion.  Discussed that if symptoms do not continue to improve with antibiotic use he will need to have this drained he was given contact information for surgeon.  Discussed alarm symptoms are warrant emergent evaluation.  Strict  return precautions given to which patient expressed understanding.  Final Clinical Impressions(s) / UC Diagnoses   Final diagnoses:  Facial abscess  Cellulitis of face     Discharge Instructions      This area will continue to drain.  Please apply Bactroban twice daily to wound.  Use warm compresses multiple times a day to help encourage drainage.  We gave you a shot of antibiotics today and I want you to  start doxycycline 100 mg twice daily for 10 days.  Try to avoid being in the sun while on doxycycline as it makes you sensitive to the sun and can give you a bad sunburn.  Use Tylenol ibuprofen for pain.  If area does not improve or if you develop additional symptoms including fever, increased pain, spread of swelling to involve your mouth or throat, difficulty speaking, shortness of breath you need to go to the emergency room.     ED Prescriptions     Medication Sig Dispense Auth. Provider   mupirocin ointment (BACTROBAN) 2 % Apply 1 application topically 2 (two) times daily. 22 g Weaver Tweed K, PA-C   doxycycline (VIBRAMYCIN) 100 MG capsule Take 1 capsule (100 mg total) by mouth 2 (two) times daily. 20 capsule Reegan Bouffard, Noberto Retort, PA-C      PDMP not reviewed this encounter.   Jeani Hawking, PA-C 02/21/21 1817    Jeani Hawking, PA-C 02/21/21 1819

## 2021-02-21 NOTE — Discharge Instructions (Addendum)
This area will continue to drain.  Please apply Bactroban twice daily to wound.  Use warm compresses multiple times a day to help encourage drainage.  We gave you a shot of antibiotics today and I want you to start doxycycline 100 mg twice daily for 10 days.  Try to avoid being in the sun while on doxycycline as it makes you sensitive to the sun and can give you a bad sunburn.  Use Tylenol ibuprofen for pain.  If area does not improve or if you develop additional symptoms including fever, increased pain, spread of swelling to involve your mouth or throat, difficulty speaking, shortness of breath you need to go to the emergency room.

## 2021-02-23 ENCOUNTER — Encounter (HOSPITAL_BASED_OUTPATIENT_CLINIC_OR_DEPARTMENT_OTHER): Payer: Self-pay | Admitting: Urology

## 2021-02-23 ENCOUNTER — Emergency Department (HOSPITAL_BASED_OUTPATIENT_CLINIC_OR_DEPARTMENT_OTHER)
Admission: EM | Admit: 2021-02-23 | Discharge: 2021-02-23 | Disposition: A | Discharge status: Home / Self Care | Payer: Managed Care, Other (non HMO) | Attending: Emergency Medicine | Admitting: Emergency Medicine

## 2021-02-23 ENCOUNTER — Other Ambulatory Visit: Payer: Self-pay

## 2021-02-23 DIAGNOSIS — L0201 Cutaneous abscess of face: Secondary | ICD-10-CM | POA: Diagnosis not present

## 2021-02-23 DIAGNOSIS — R220 Localized swelling, mass and lump, head: Secondary | ICD-10-CM | POA: Diagnosis present

## 2021-02-23 DIAGNOSIS — Z87891 Personal history of nicotine dependence: Secondary | ICD-10-CM | POA: Insufficient documentation

## 2021-02-23 MED ORDER — DOXYCYCLINE HYCLATE 100 MG PO TABS
100.0000 mg | ORAL_TABLET | Freq: Once | ORAL | Status: AC
  Administered 2021-02-23: 03:00:00 100 mg via ORAL
  Filled 2021-02-23: qty 1

## 2021-02-23 MED ORDER — LIDOCAINE-EPINEPHRINE (PF) 2 %-1:200000 IJ SOLN
20.0000 mL | Freq: Once | INTRAMUSCULAR | Status: AC
  Administered 2021-02-23: 20 mL via INTRADERMAL

## 2021-02-23 MED ORDER — LIDOCAINE-EPINEPHRINE 2 %-1:100000 IJ SOLN: 20.0000 mL | Freq: Once | INTRAMUSCULAR | Status: DC

## 2021-02-23 MED ORDER — OXYCODONE-ACETAMINOPHEN 5-325 MG PO TABS
2.0000 | ORAL_TABLET | Freq: Once | ORAL | Status: AC
  Administered 2021-02-23: 03:00:00 2 via ORAL
  Filled 2021-02-23: qty 2

## 2021-02-23 NOTE — ED Triage Notes (Signed)
Left sided facial abscess that started 2 days ago, purulent drainage noted, Seen at [facility] yesterday and started on antibiotic

## 2021-02-23 NOTE — ED Provider Notes (Signed)
[facility] Provider Note: Gregory Dell, MD, FACEP  CSN: [id number] MRN: [id number] ARRIVAL: 02/23/21 at 0057 ROOM: [facility]   CHIEF COMPLAINT  Abscess   HISTORY OF PRESENT ILLNESS  02/23/21 3:02 AM Gregory Woodward is a 35 y.o. male who has had a tender, swollen area on his left cheek that started 3 days ago.  He was seen at a [facility] urgent care 2 days ago and prescribed doxycycline and topical mupirocin.  He did not get these filled due to cost.  Despite these his symptoms of worsened.  He rates associated pain as a 10 out of 10, worse with movement or palpation.  He denies any dental pain.  He is requesting I&D.   Past Medical History:  Diagnosis Date   Cellulitis 2021   Chronic abdominal pain    Ulcerative colitis (HCC)     History reviewed. No pertinent surgical history.  History reviewed. No pertinent family history.  Social History   Tobacco Use   Smoking status: Former    Pack years: 0.00   Smokeless tobacco: Never  Vaping Use   Vaping Use: Never used  Substance Use Topics   Alcohol use: No   Drug use: Not Currently    Prior to Admission medications   Medication Sig Start Date End Date Taking? Authorizing Provider  doxycycline (VIBRAMYCIN) 100 MG capsule Take 1 capsule (100 mg total) by mouth 2 (two) times daily. 02/21/21   Raspet, Noberto Retort, PA-C    Allergies Onion   REVIEW OF SYSTEMS  Negative except as noted here or in the History of Present Illness.   PHYSICAL EXAMINATION  Initial Vital Signs Blood pressure 133/81, pulse (!) 115, temperature 98.9 F (37.2 C), temperature source Oral, resp. rate 18, height 6' (1.829 m), weight 108.9 kg, SpO2 99 %.  Examination General: Well-developed, well-nourished male in no acute distress; appearance consistent with age of record HENT: normocephalic; atraumatic; tender, swollen area of left cheek consistent with abscess:    Eyes: Normal appearance Neck: supple Heart: regular rate and  rhythm Lungs: clear to auscultation bilaterally Abdomen: soft; nondistended; nontender; bowel sounds present Extremities: No deformity; full range of motion Neurologic: Awake, alert; motor function intact in all extremities and symmetric; no facial droop Skin: Warm and dry Psychiatric: Grimacing   RESULTS  Summary of this visit's results, reviewed and interpreted by myself:   EKG Interpretation  Date/Time:    Ventricular Rate:    PR Interval:    QRS Duration:   QT Interval:    QTC Calculation:   R Axis:     Text Interpretation:          Laboratory Studies: No results found for this or any previous visit (from the past 24 hour(s)). Imaging Studies: No results found.  ED COURSE and MDM  Nursing notes, initial and subsequent vitals signs, including pulse oximetry, reviewed and interpreted by myself.  Vitals:   02/23/21 0101 02/23/21 0102 02/23/21 0104  BP:   133/81  Pulse:   (!) 115  Resp:   18  Temp:   98.9 F (37.2 C)  TempSrc:   Oral  SpO2: 99%  99%  Weight:  108.9 kg   Height:  6' (1.829 m)    Medications  doxycycline (VIBRA-TABS) tablet 100 mg (has no administration in time range)  oxyCODONE-acetaminophen (PERCOCET/ROXICET) 5-325 MG per tablet 2 tablet (has no administration in time range)  lidocaine-EPINEPHrine (XYLOCAINE W/EPI) 2 %-1:200000 (PF) injection 20 mL (20 mLs Intradermal Given 02/23/21  1638)    Patient agrees to I&D and understands that we will likely leave a visible scar on his cheek.  Patient advised to fill his doxycycline.  The mupirocin, which was likely the more expensive of the two, should not be needed at this point.  PROCEDURES  Procedures INCISION AND DRAINAGE Performed by: Carlisle Beers Dink Creps Consent: Verbal consent obtained. Risks and benefits: risks, benefits and alternatives were discussed Type: abscess  Body area: Left cheek  Anesthesia: local infiltration  Incision was made with a scalpel.  Local anesthetic: lidocaine 2% with  epinephrine  Anesthetic total: 2.5 ml  Complexity: complex Blunt dissection to break up loculations  Drainage: purulent  Drainage amount: Small to moderate  Packing material: None  Patient tolerance: Patient tolerated the procedure well with no immediate complications.   ED DIAGNOSES     ICD-10-CM   1. Abscess of left external cheek  L02.01          Misbah Hornaday, Jonny Ruiz, MD 02/23/21 [phone]

## 2021-05-04 ENCOUNTER — Ambulatory Visit (HOSPITAL_COMMUNITY)
Admission: EM | Admit: 2021-05-04 | Discharge: 2021-05-04 | Discharge status: Against Medical Advice | Payer: Managed Care, Other (non HMO)

## 2021-05-04 NOTE — ED Notes (Signed)
No response when called from lobby. 

## 2021-05-04 NOTE — ED Notes (Signed)
Pt called once in lobby with no answer ° °

## 2021-05-15 ENCOUNTER — Emergency Department (HOSPITAL_COMMUNITY)
Admission: EM | Admit: 2021-05-15 | Discharge: 2021-05-15 | Disposition: A | Discharge status: Home / Self Care | Payer: Managed Care, Other (non HMO) | Attending: Emergency Medicine | Admitting: Emergency Medicine

## 2021-05-15 ENCOUNTER — Encounter (HOSPITAL_COMMUNITY): Payer: Self-pay

## 2021-05-15 DIAGNOSIS — Z202 Contact with and (suspected) exposure to infections with a predominantly sexual mode of transmission: Secondary | ICD-10-CM | POA: Insufficient documentation

## 2021-05-15 DIAGNOSIS — Z87891 Personal history of nicotine dependence: Secondary | ICD-10-CM | POA: Diagnosis not present

## 2021-05-15 DIAGNOSIS — R110 Nausea: Secondary | ICD-10-CM | POA: Insufficient documentation

## 2021-05-15 DIAGNOSIS — Z711 Person with feared health complaint in whom no diagnosis is made: Secondary | ICD-10-CM

## 2021-05-15 LAB — URINALYSIS, ROUTINE W REFLEX MICROSCOPIC
Bilirubin Urine: NEGATIVE
Glucose, UA: NEGATIVE mg/dL
Hgb urine dipstick: NEGATIVE
Ketones, ur: NEGATIVE mg/dL
Leukocytes,Ua: NEGATIVE
Nitrite: NEGATIVE
Protein, ur: NEGATIVE mg/dL
Specific Gravity, Urine: 1.025 (ref 1.005–1.030)
pH: 6 (ref 5.0–8.0)

## 2021-05-15 LAB — RPR: RPR Ser Ql: NONREACTIVE

## 2021-05-15 LAB — HIV ANTIBODY (ROUTINE TESTING W REFLEX): HIV Screen 4th Generation wRfx: NONREACTIVE

## 2021-05-15 MED ORDER — DOXYCYCLINE HYCLATE 100 MG PO TABS
100.0000 mg | ORAL_TABLET | Freq: Once | ORAL | Status: AC
  Administered 2021-05-15: 100 mg via ORAL
  Filled 2021-05-15: qty 1

## 2021-05-15 MED ORDER — DOXYCYCLINE HYCLATE 100 MG PO CAPS: 100.0000 mg | ORAL_CAPSULE | Freq: Two times a day (BID) | ORAL | 0 refills | Status: DC

## 2021-05-15 MED ORDER — CEFTRIAXONE SODIUM 500 MG IJ SOLR
500.0000 mg | Freq: Once | INTRAMUSCULAR | Status: AC
  Administered 2021-05-15: 500 mg via INTRAMUSCULAR
  Filled 2021-05-15: qty 500

## 2021-05-15 MED ORDER — STERILE WATER FOR INJECTION IJ SOLN
INTRAMUSCULAR | Status: AC
  Filled 2021-05-15: qty 10

## 2021-05-15 NOTE — Discharge Instructions (Addendum)
Take Doxycycline as directed until gone.   Return to the ED or go to your primary care doctor for recheck if symptoms worsen.

## 2021-05-15 NOTE — ED Provider Notes (Signed)
[facility] EMERGENCY DEPARTMENT Provider Note   CSN: [id number] Arrival date & time: 05/15/21  0247     History Chief Complaint  Patient presents with   [facility] TRANSMITTED DISEASE    Gregory Woodward is a 35 y.o. male.  Patient to ED with concern for STD. He reports having 2 raised, red areas on his penis that are painless. No penile discharge, abdominal pain, fever. He reports having some mild nausea. His girlfriend is also here for evaluation of vaginal lesions which is what caused concern for him. No dysuria.   The history is provided by the patient. No language interpreter was used.      Past Medical History:  Diagnosis Date   Cellulitis 2021   Chronic abdominal pain    Ulcerative colitis [facility])     Patient Active Problem List   Diagnosis Date Noted   Cellulitis 08/03/2020    History reviewed. No pertinent surgical history.     No family history on file.  Social History   Tobacco Use   Smoking status: Former   Smokeless tobacco: Never  Building services engineer Use: Never used  Substance Use Topics   Alcohol use: No   Drug use: Not Currently    Home Medications Prior to Admission medications   Medication Sig Start Date End Date Taking? Authorizing Provider  doxycycline (VIBRAMYCIN) 100 MG capsule Take 1 capsule (100 mg total) by mouth 2 (two) times daily. 02/21/21   Raspet, Noberto Retort, PA-C    Allergies    Onion  Review of Systems   Review of Systems  Constitutional:  Negative for fever.  Gastrointestinal:  Positive for nausea. Negative for abdominal pain and vomiting.  Genitourinary:  Positive for genital sores. Negative for dysuria, penile discharge, scrotal swelling and testicular pain.  Musculoskeletal:  Negative for myalgias.   Physical Exam Updated Vital Signs BP (!) 127/91 (BP Location: Right Arm)   Pulse (!) 111   Temp 99.1 F (37.3 C) (Oral)   Resp 18   SpO2 97%   Physical Exam Vitals and nursing note reviewed.   Constitutional:      Appearance: He is well-developed.  Pulmonary:     Effort: Pulmonary effort is normal.  Genitourinary:    Comments: Circumcised. There is a raised singular bump, minimally red with central black spot. No ulceration. Nontender. Lesion is on the penile shaft. There is a second minimally raised, nonerythematous, nonulcerating lesion to the tip of the penis. No blistering. No surrounding redness. No urethral discharge.  Musculoskeletal:        General: Normal range of motion.     Cervical back: Normal range of motion.  Skin:    General: Skin is warm and dry.  Neurological:     Mental Status: He is alert and oriented to person, place, and time.    ED Results / Procedures / Treatments   Labs (all labs ordered are listed, but only abnormal results are displayed) Labs Reviewed - No data to display  EKG None  Radiology No results found.  Procedures Procedures   Medications Ordered in ED Medications - No data to display  ED Course  I have reviewed the triage vital signs and the nursing notes.  Pertinent labs & imaging results that were available during my care of the patient were reviewed by me and considered in my medical decision making (see chart for details).    MDM Rules/Calculators/A&P  Patient to ED for evaluation of possible STD's, here wit hGR reporting painful vulvar sores.   He is requesting STD testing. He prefers to be treated in the ED for GC-chlamydia. Would like the penicillin for syphilis coverage. Reports he has had it before and had received full treatment at that time.   Labs pending at time of discharge. Patient is instructed on MyChart access.   Final Clinical Impression(s) / ED Diagnoses Final diagnoses:  None   STD exposure  Rx / DC Orders ED Discharge Orders     None        Elpidio Anis, PA-C 05/16/21 0157    Shon Baton, MD 05/20/21 [id number]

## 2021-05-15 NOTE — ED Triage Notes (Signed)
Pt states he's got "a couple bumps" on his penis Sunday, endorses recent unprotected sexual activity, concern for STI

## 2021-05-16 LAB — GC/CHLAMYDIA PROBE AMP (~~LOC~~) NOT AT ARMC
Chlamydia: NEGATIVE
Comment: NEGATIVE
Comment: NORMAL
Neisseria Gonorrhea: NEGATIVE

## 2021-09-19 ENCOUNTER — Emergency Department (HOSPITAL_COMMUNITY)
Admission: EM | Admit: 2021-09-19 | Discharge: 2021-09-20 | Disposition: A | Discharge status: Home / Self Care | Payer: Self-pay | Attending: Student | Admitting: Student

## 2021-09-19 ENCOUNTER — Other Ambulatory Visit: Payer: Self-pay

## 2021-09-19 ENCOUNTER — Emergency Department (HOSPITAL_COMMUNITY): Payer: Self-pay

## 2021-09-19 DIAGNOSIS — E119 Type 2 diabetes mellitus without complications: Secondary | ICD-10-CM | POA: Insufficient documentation

## 2021-09-19 DIAGNOSIS — M79671 Pain in right foot: Secondary | ICD-10-CM | POA: Insufficient documentation

## 2021-09-19 DIAGNOSIS — Z5321 Procedure and treatment not carried out due to patient leaving prior to being seen by health care provider: Secondary | ICD-10-CM | POA: Insufficient documentation

## 2021-09-19 DIAGNOSIS — R2241 Localized swelling, mass and lump, right lower limb: Secondary | ICD-10-CM | POA: Insufficient documentation

## 2021-09-19 NOTE — ED Provider Triage Note (Signed)
Emergency Medicine Provider Triage Evaluation Note  Gregory Woodward , a 36 y.o. male  was evaluated in triage.  Pt complains of right little toe pain and swelling.  Started 3 days ago, erythematous with worsening swelling.  Pain with bearing weight, denies any trauma or injury to it.  Not having any fevers at home, history of cellulitis at this does not feel like that.  No history of diabetes, no history of gout..  Review of Systems  Positive: Toe pain, swelling, erythema Negative: fever  Physical Exam  BP 119/82 (BP Location: Right Arm)    Pulse (!) 111    Temp 99.3 F (37.4 C) (Oral)    Resp 20    Ht 6' (1.829 m)    Wt 108.9 kg    SpO2 98%    BMI 32.55 kg/m  Gen:   Awake, no distress   Resp:  Normal effort  MSK:   Moves extremities without difficulty  Other:  DP PT 2+.  Erythematous little toe to the right, localized swelling.  Onychomycosis  Medical Decision Making  Medically screening exam initiated at 9:26 PM.  Appropriate orders placed.  Gregory Woodward was informed that the remainder of the evaluation will be completed by another provider, this initial triage assessment does not replace that evaluation, and the importance of remaining in the ED until their evaluation is complete.  X-ray ordered due to tenderness.  Able to tolerate passive range of motion so does not appear like septic joint.  Could be gout, does not appear consistent cellulitis.   Gregory Arista, PA-C 09/19/21 2126

## 2021-09-19 NOTE — ED Triage Notes (Signed)
Pt report increased swelling and redness to right pinky toe x3 days. C/o inability to bear weight on right foot d/t pain. Denies fevers/chills. No known history of DM.

## 2022-03-07 ENCOUNTER — Ambulatory Visit (HOSPITAL_COMMUNITY)
Admission: EM | Admit: 2022-03-07 | Discharge: 2022-03-07 | Disposition: A | Discharge status: Home / Self Care | Payer: No Payment, Other | Attending: Behavioral Health | Admitting: Behavioral Health

## 2022-03-07 DIAGNOSIS — F1910 Other psychoactive substance abuse, uncomplicated: Secondary | ICD-10-CM | POA: Insufficient documentation

## 2022-03-07 DIAGNOSIS — F4322 Adjustment disorder with anxiety: Secondary | ICD-10-CM | POA: Insufficient documentation

## 2022-03-07 NOTE — Discharge Instructions (Signed)
Discharge recommendations:   Please see information for follow-up appointment with psychiatry and therapy.  Please follow up with your primary care provider for all medical related needs.   Therapy: We recommend that patient participate in individual therapy to address mental health concerns.  Safety:  The patient should abstain from use of illicit substances/drugs and abuse of any medications. If symptoms worsen or do not continue to improve or if the patient becomes actively suicidal or homicidal then it is recommended that the patient return to the closest hospital emergency department, the [facility], or call 911 for further evaluation and treatment. National Suicide Prevention Lifeline 1-800-SUICIDE or 1-800-273-8255.  About 988 988 offers 24/7 access to trained crisis counselors who can help people experiencing mental health-related distress. People can call or text 988 or chat 988lifeline.org for themselves or if they are worried about a loved one who may need crisis support.      

## 2022-03-07 NOTE — ED Notes (Signed)
Patient A&O x 4, ambulatory. Patient discharged in no acute distress. Patient denied SI/HI, A/VH upon discharge. Patient verbalized understanding of all discharge instructions explained by staff, to include follow up appointments and safety plan. Patient escorted to lobby via staff. Safety maintained.   

## 2022-03-07 NOTE — Progress Notes (Signed)
Patient is a 36 year old male that presents this date voluntary after being discharged from [facility] on June 11th after a 5 day detox program from Heroin and Methamphetamines. Patient states on discharge he maintained his sobriety for 2 days relapsing on both heroin and methamphetamines soon thereafter. Patient reports continued use of both substances until June the 26th when he stopped using "for his daughter" which whom he resides with. Patient denies any S/I, H/I or AVH. Patient denies any prior mental health history or medication interventions to assist with symptom management in the form of a harm reduction program. Patient states since he has been maintaining his sobriety he frequently has "dope dreams," racing thoughts and excessive anxiety. Patient states this date he is interested in a program to maintain his sobriety and pursue his ongoing recovery efforts. This Clinical research associate and White NP discussed area programs and recommended a harm reduction program at [facility]. Patent to be discharged with that informed and encouraged to follow up with resources provided.

## 2022-03-07 NOTE — ED Provider Notes (Signed)
Behavioral Health Urgent Care Medical Screening Exam  Patient Name: Gregory Woodward MRN: [id number]   Diagnosis:  Final diagnoses:  Adjustment disorder with anxious mood  Polysubstance abuse (HCC)    History of Present illness: Gregory Woodward is a 36 y.o. male patient who presents to the [facility] behavioral health urgent care voluntary as a walk-in with a chief complaint of horrible anxiety.  Patient seen and evaluated face-to-face by this provider, and chart reviewed. On evaluation, patient is alert and oriented x4. His thought process is logical and relevant. His speech is clear and coherent. His mood is anxious and affect is congruent. He is noted to be fidgety throughout the assessment.  Patient states that he is here because his sister wants him to get help. He states that he was hospitalized at [facility] in June 2023 for 5 days and was discharged on the 11th. He states that he immediately relapsed on heroin and methamphetamines  for 2 weeks. He states that he has been clean from heroin and meth since June 26th. He denies current drug or alcohol use. He reports using heroin off and on for years. He reports using meth for a few months. He states that since he stopped using drugs he has been experiencing horrible anxiety that he describes as mind racing, difficulty sitting still, dope dreams and craving drugs. He states that he does not know how to function anymore without using drugs. He states that he is trying to stay sober for his 44-year-old daughter. He denies SI/HI/AVH. There is no objective evidence that he is responding to internal or external stimuli. He denies a past psychiatric history other than polysubstance use. He denies current outpatient substance abuse treatment, psychiatry or therapy.   Patient states this date he is interested in a program to maintain his sobriety and pursue his ongoing recovery efforts. TTS counselor Onalee Hua and I discussed area programs and recommended a  harm reduction program at [facility]. Patent to be discharged with that information and encouraged to follow up with resources provided.          Psychiatric Specialty Exam  Presentation  General Appearance:Appropriate for Environment  Eye Contact:Fair  Speech:Clear and Coherent  Speech Volume:Normal  Handedness:No data recorded  Mood and Affect  Mood:Anxious  Affect:Congruent   Thought Process  Thought Processes:Coherent; Goal Directed  Descriptions of Associations:Intact  Orientation:Full (Time, Place and Person)  Thought Content:Logical    Hallucinations:None  Ideas of Reference:None  Suicidal Thoughts:No  Homicidal Thoughts:No   Sensorium  Memory:Immediate Fair; Remote Fair; Recent Fair  Judgment:Intact  Insight:Present   Executive Functions  Concentration:Fair  Attention Span:Fair  Recall:Fair  Fund of Knowledge:Fair  Language:Fair   Psychomotor Activity  Psychomotor Activity:Restlessness   Assets  Assets:Communication Skills; Desire for Improvement; Housing; Leisure Time; Intimacy; Physical Health; Social Support; Transportation   Sleep  Sleep:Poor  Number of hours: 5  Physical Exam: Physical Exam HENT:     Head: Normocephalic.     Nose: Nose normal.  Eyes:     Conjunctiva/sclera: Conjunctivae normal.  Cardiovascular:     Rate and Rhythm: Normal rate.  Pulmonary:     Effort: Pulmonary effort is normal.  Musculoskeletal:        General: Normal range of motion.     Cervical back: Normal range of motion.  Neurological:     Mental Status: He is alert and oriented to person, place, and time.    Review of Systems  Constitutional: Negative.  HENT: Negative.    Eyes: Negative.   Respiratory: Negative.    Cardiovascular: Negative.   Gastrointestinal: Negative.   Genitourinary: Negative.   Musculoskeletal: Negative.   Skin: Negative.   Neurological: Negative.   Endo/Heme/Allergies: Negative.    Blood  pressure 125/83, pulse 83, temperature 98.4 F (36.9 C), temperature source Oral, resp. rate 19, SpO2 100 %. There is no height or weight on file to calculate BMI.  Musculoskeletal: Strength & Muscle Tone: within normal limits Gait & Station: normal Patient leans: N/A   [facility] MSE Discharge Disposition for Follow up and Recommendations: Based on my evaluation the patient does not appear to have an emergency medical condition and can be discharged with resources and follow up care in outpatient services for substance use treatment.    Follow-up Information     Call  [facility]   Why: Please contact if you are interested in establishing treatment. Contact information: [facility] (offers outpatient therapy and intensive outpatient substance abuse therapy).  [address], [place], [place] [address] Phone: [phone]                 Layla Barter, NP 03/07/2022, 5:58 PM

## 2022-04-05 ENCOUNTER — Encounter (HOSPITAL_COMMUNITY): Payer: Self-pay

## 2022-04-05 ENCOUNTER — Emergency Department (HOSPITAL_COMMUNITY): Payer: No Typology Code available for payment source

## 2022-04-05 ENCOUNTER — Emergency Department (HOSPITAL_COMMUNITY)
Admission: EM | Admit: 2022-04-05 | Discharge: 2022-04-05 | Disposition: A | Discharge status: Home / Self Care | Payer: No Typology Code available for payment source | Attending: Emergency Medicine | Admitting: Emergency Medicine

## 2022-04-05 ENCOUNTER — Other Ambulatory Visit: Payer: Self-pay

## 2022-04-05 DIAGNOSIS — R0789 Other chest pain: Secondary | ICD-10-CM | POA: Insufficient documentation

## 2022-04-05 DIAGNOSIS — R1011 Right upper quadrant pain: Secondary | ICD-10-CM | POA: Diagnosis present

## 2022-04-05 DIAGNOSIS — R000 Tachycardia, unspecified: Secondary | ICD-10-CM | POA: Diagnosis not present

## 2022-04-05 DIAGNOSIS — Y92410 Unspecified street and highway as the place of occurrence of the external cause: Secondary | ICD-10-CM | POA: Diagnosis not present

## 2022-04-05 DIAGNOSIS — M542 Cervicalgia: Secondary | ICD-10-CM | POA: Diagnosis not present

## 2022-04-05 DIAGNOSIS — R519 Headache, unspecified: Secondary | ICD-10-CM | POA: Insufficient documentation

## 2022-04-05 DIAGNOSIS — R739 Hyperglycemia, unspecified: Secondary | ICD-10-CM | POA: Diagnosis not present

## 2022-04-05 LAB — BASIC METABOLIC PANEL
Anion gap: 9 (ref 5–15)
BUN: 13 mg/dL (ref 6–20)
CO2: 24 mmol/L (ref 22–32)
Calcium: 9.7 mg/dL (ref 8.9–10.3)
Chloride: 110 mmol/L (ref 98–111)
Creatinine, Ser: 0.86 mg/dL (ref 0.61–1.24)
GFR, Estimated: >60 mL/min (ref >60)
Glucose, Bld: 104 mg/dL — ABNORMAL HIGH (ref 70–99)
Potassium: 4.5 mmol/L (ref 3.5–5.1)
Sodium: 143 mmol/L (ref 135–145)

## 2022-04-05 MED ORDER — IBUPROFEN 800 MG PO TABS
800.0000 mg | ORAL_TABLET | Freq: Once | ORAL | Status: AC
  Administered 2022-04-05: 800 mg via ORAL
  Filled 2022-04-05: qty 1

## 2022-04-05 MED ORDER — IOHEXOL 300 MG/ML  SOLN
100.0000 mL | Freq: Once | INTRAMUSCULAR | Status: AC | PRN
  Administered 2022-04-05: 100 mL via INTRAVENOUS

## 2022-04-05 MED ORDER — CYCLOBENZAPRINE HCL 10 MG PO TABS: 10.0000 mg | ORAL_TABLET | Freq: Two times a day (BID) | ORAL | 0 refills | Status: AC | PRN

## 2022-04-05 NOTE — Discharge Instructions (Addendum)
Please return to the ED with any new or worsening signs or symptoms Please begin taking ibuprofen/Tylenol as needed every 6-8 hours for pain.  Please also take muscle relaxers as needed.  Please do not drive or operate machinery under the influence of muscle relaxers. Please be aware that you will most likely wake up in the morning with increased aches and pains.  This is to be expected.
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
## 2022-05-30 ENCOUNTER — Other Ambulatory Visit: Payer: Self-pay

## 2022-05-30 ENCOUNTER — Emergency Department (HOSPITAL_COMMUNITY)
Admission: EM | Admit: 2022-05-30 | Discharge: 2022-05-30 | Disposition: A | Discharge status: Home / Self Care | Payer: Self-pay | Attending: Emergency Medicine | Admitting: Emergency Medicine

## 2022-05-30 DIAGNOSIS — T402X1A Poisoning by other opioids, accidental (unintentional), initial encounter: Secondary | ICD-10-CM | POA: Insufficient documentation

## 2022-05-30 DIAGNOSIS — T40601A Poisoning by unspecified narcotics, accidental (unintentional), initial encounter: Secondary | ICD-10-CM

## 2022-05-30 DIAGNOSIS — X58XXXA Exposure to other specified factors, initial encounter: Secondary | ICD-10-CM | POA: Insufficient documentation

## 2022-05-30 DIAGNOSIS — R000 Tachycardia, unspecified: Secondary | ICD-10-CM | POA: Insufficient documentation

## 2022-05-30 MED ORDER — SODIUM CHLORIDE 0.9 % IV BOLUS
1000.0000 mL | Freq: Once | INTRAVENOUS | Status: AC
  Administered 2022-05-30: 1000 mL via INTRAVENOUS

## 2022-05-30 MED ORDER — NALOXONE HCL 4 MG/0.1ML NA LIQD: 1.0000 | Freq: Once | NASAL | 0 refills | Status: AC
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
## 2022-09-13 ENCOUNTER — Emergency Department (HOSPITAL_BASED_OUTPATIENT_CLINIC_OR_DEPARTMENT_OTHER): Payer: Self-pay

## 2022-09-13 ENCOUNTER — Emergency Department (HOSPITAL_BASED_OUTPATIENT_CLINIC_OR_DEPARTMENT_OTHER)
Admission: EM | Admit: 2022-09-13 | Discharge: 2022-09-14 | Disposition: A | Discharge status: Home / Self Care | Payer: Self-pay | Attending: Emergency Medicine | Admitting: Emergency Medicine

## 2022-09-13 ENCOUNTER — Other Ambulatory Visit: Payer: Self-pay

## 2022-09-13 DIAGNOSIS — Z79899 Other long term (current) drug therapy: Secondary | ICD-10-CM | POA: Insufficient documentation

## 2022-09-13 DIAGNOSIS — A084 Viral intestinal infection, unspecified: Secondary | ICD-10-CM | POA: Insufficient documentation

## 2022-09-13 DIAGNOSIS — R1032 Left lower quadrant pain: Secondary | ICD-10-CM

## 2022-09-13 DIAGNOSIS — Z20822 Contact with and (suspected) exposure to covid-19: Secondary | ICD-10-CM | POA: Insufficient documentation

## 2022-09-13 LAB — COMPREHENSIVE METABOLIC PANEL
ALT: 16 U/L (ref 0–44)
AST: 15 U/L (ref 15–41)
Albumin: 3.7 g/dL (ref 3.5–5.0)
Alkaline Phosphatase: 66 U/L (ref 38–126)
Anion gap: 7 (ref 5–15)
BUN: 9 mg/dL (ref 6–20)
CO2: 21 mmol/L — ABNORMAL LOW (ref 22–32)
Calcium: 8.6 mg/dL — ABNORMAL LOW (ref 8.9–10.3)
Chloride: 107 mmol/L (ref 98–111)
Creatinine, Ser: 0.69 mg/dL (ref 0.61–1.24)
GFR, Estimated: >60 mL/min (ref >60)
Glucose, Bld: 109 mg/dL — ABNORMAL HIGH (ref 70–99)
Potassium: 3.9 mmol/L (ref 3.5–5.1)
Sodium: 135 mmol/L (ref 135–145)
Total Bilirubin: 0.5 mg/dL (ref 0.3–1.2)
Total Protein: 7.1 g/dL (ref 6.5–8.1)

## 2022-09-13 LAB — URINALYSIS, ROUTINE W REFLEX MICROSCOPIC
Bilirubin Urine: NEGATIVE
Glucose, UA: NEGATIVE mg/dL
Hgb urine dipstick: NEGATIVE
Ketones, ur: NEGATIVE mg/dL
Leukocytes,Ua: NEGATIVE
Nitrite: NEGATIVE
Protein, ur: NEGATIVE mg/dL
Specific Gravity, Urine: 1.02 (ref 1.005–1.030)
pH: 5 (ref 5.0–8.0)

## 2022-09-13 LAB — CBC
HCT: 39.9 % (ref 39.0–52.0)
Hemoglobin: 12.9 g/dL — ABNORMAL LOW (ref 13.0–17.0)
MCH: 24.1 pg — ABNORMAL LOW (ref 26.0–34.0)
MCHC: 32.3 g/dL (ref 30.0–36.0)
MCV: 74.4 fL — ABNORMAL LOW (ref 80.0–100.0)
Platelets: 301 10*3/uL (ref 150–400)
RBC: 5.36 MIL/uL (ref 4.22–5.81)
RDW: 14.1 % (ref 11.5–15.5)
WBC: 4.2 10*3/uL (ref 4.0–10.5)
nRBC: 0 % (ref 0.0–0.2)

## 2022-09-13 LAB — RESP PANEL BY RT-PCR (RSV, FLU A&B, COVID)  RVPGX2
Influenza A by PCR: NEGATIVE
Influenza B by PCR: NEGATIVE
Resp Syncytial Virus by PCR: NEGATIVE
SARS Coronavirus 2 by RT PCR: NEGATIVE

## 2022-09-13 LAB — RAPID URINE DRUG SCREEN, HOSP PERFORMED
Amphetamines: POSITIVE — AB
Barbiturates: NOT DETECTED
Benzodiazepines: NOT DETECTED
Cocaine: NOT DETECTED
Opiates: NOT DETECTED
Tetrahydrocannabinol: NOT DETECTED

## 2022-09-13 LAB — LIPASE, BLOOD: Lipase: 25 U/L (ref 11–51)

## 2022-09-13 LAB — OCCULT BLOOD X 1 CARD TO LAB, STOOL: Fecal Occult Bld: NEGATIVE

## 2022-09-13 MED ORDER — DICYCLOMINE HCL 10 MG/ML IM SOLN
20.0000 mg | Freq: Once | INTRAMUSCULAR | Status: AC
  Administered 2022-09-13: 20 mg via INTRAMUSCULAR
  Filled 2022-09-13: qty 2

## 2022-09-13 MED ORDER — IOHEXOL 300 MG/ML  SOLN
100.0000 mL | Freq: Once | INTRAMUSCULAR | Status: AC | PRN
  Administered 2022-09-13: 100 mL via INTRAVENOUS

## 2022-09-13 MED ORDER — ONDANSETRON HCL 4 MG/2ML IJ SOLN
4.0000 mg | Freq: Once | INTRAMUSCULAR | Status: AC
  Administered 2022-09-13: 4 mg via INTRAVENOUS
  Filled 2022-09-13: qty 2

## 2022-09-13 MED ORDER — SODIUM CHLORIDE 0.9 % IV BOLUS
1000.0000 mL | Freq: Once | INTRAVENOUS | Status: AC
  Administered 2022-09-13: 1000 mL via INTRAVENOUS

## 2022-09-13 NOTE — ED Provider Notes (Signed)
[facility] EMERGENCY DEPARTMENT Provider Note   CSN: [id number] Arrival date & time: 09/13/22  1823     History  Chief Complaint  Patient presents with   Blood In Stools   Diarrhea    Gregory Woodward is a 37 y.o. male.  37 y.o male with a PMH of UC currently under no med patient presents to the ED with a chief complaint of left lower abdominal pain for the past 2 days.  Patient reports he ate Janine Limbo 2 days ago, has continued to have a cramping, stabbing sensation to the left lower quadrant radiating across his entire abdomen.  Alleviating or exacerbating factors, he has not taken any medication for improvement in symptoms.  His last oral intake was prior to arrival consistent with a burger.  He does report 1 episode of nonbilious, nonbloody emesis.  He does endorse multiple episodes of diarrhea, reports that he feels like he cannot make it to the bathroom, there is some blood noted on his stool.  He has not been running any fevers. No chest pain or other complaints.  No prior intervention of the abdomen.  The history is provided by the patient and medical records.  Diarrhea Quality:  Explosive Severity:  Moderate Onset quality:  Gradual Duration:  2 days Timing:  Constant Progression:  Unchanged Relieved by:  Nothing Associated symptoms: abdominal pain and vomiting   Associated symptoms: no chills, no fever and no headaches        Home Medications Prior to Admission medications   Medication Sig Start Date End Date Taking? Authorizing Provider  cyclobenzaprine (FLEXERIL) 10 MG tablet Take 1 tablet (10 mg total) by mouth 2 (two) times daily as needed for muscle spasms. Patient not taking: Reported on 05/30/2022 04/05/22   Azucena Cecil, PA-C  doxycycline (VIBRAMYCIN) 100 MG capsule Take 1 capsule (100 mg total) by mouth 2 (two) times daily. Patient not taking: Reported on 05/30/2022 05/15/21   Charlann Lange, PA-C      Allergies    Onion    Review of Systems    Review of Systems  Constitutional:  Negative for chills and fever.  HENT:  Negative for sore throat.   Respiratory:  Negative for shortness of breath.   Cardiovascular:  Negative for chest pain.  Gastrointestinal:  Positive for abdominal pain, blood in stool, diarrhea, nausea and vomiting.  Genitourinary:  Negative for flank pain.  Musculoskeletal:  Negative for back pain.  Skin:  Negative for pallor and wound.  Neurological:  Negative for light-headedness and headaches.  All other systems reviewed and are negative.   Physical Exam Updated Vital Signs BP (!) 130/90   Pulse 90   Temp 98 F (36.7 C)   Resp 16   Ht 6' (1.829 m)   Wt 90.7 kg   SpO2 100%   BMI 27.12 kg/m  Physical Exam Vitals and nursing note reviewed.  Constitutional:      Appearance: Normal appearance.  HENT:     Head: Normocephalic and atraumatic.     Mouth/Throat:     Mouth: Mucous membranes are dry.  Eyes:     Pupils: Pupils are equal, round, and reactive to light.  Cardiovascular:     Rate and Rhythm: Normal rate.  Pulmonary:     Effort: Pulmonary effort is normal.     Breath sounds: No wheezing.  Abdominal:     General: Abdomen is flat.     Palpations: Abdomen is soft.     Tenderness:  There is abdominal tenderness. There is guarding. There is no right CVA tenderness or left CVA tenderness.  Musculoskeletal:     Cervical back: Normal range of motion and neck supple.  Skin:    General: Skin is warm and dry.  Neurological:     Mental Status: He is alert and oriented to person, place, and time.     ED Results / Procedures / Treatments   Labs (all labs ordered are listed, but only abnormal results are displayed) Labs Reviewed  COMPREHENSIVE METABOLIC PANEL - Abnormal; Notable for the following components:      Result Value   CO2 21 (*)    Glucose, Bld 109 (*)    Calcium 8.6 (*)    All other components within normal limits  CBC - Abnormal; Notable for the following components:    Hemoglobin 12.9 (*)    MCV 74.4 (*)    MCH 24.1 (*)    All other components within normal limits  RAPID URINE DRUG SCREEN, HOSP PERFORMED - Abnormal; Notable for the following components:   Amphetamines POSITIVE (*)    All other components within normal limits  RESP PANEL BY RT-PCR (RSV, FLU A&B, COVID)  RVPGX2  LIPASE, BLOOD  URINALYSIS, ROUTINE W REFLEX MICROSCOPIC  OCCULT BLOOD X 1 CARD TO LAB, STOOL    EKG None  Radiology CT ABDOMEN PELVIS W CONTRAST  Result Date: 09/13/2022 CLINICAL DATA:  LLQ abdominal pain C/o ate food 2 days ago, upset stomach afterwards--> diarrhea. Hx of ulcerative colitis. Tolerating PO intake. Also reports BRBPR x2 days EXAM: CT ABDOMEN AND PELVIS WITH CONTRAST TECHNIQUE: Multidetector CT imaging of the abdomen and pelvis was performed using the standard protocol following bolus administration of intravenous contrast. RADIATION DOSE REDUCTION: This exam was performed according to the departmental dose-optimization program which includes automated exposure control, adjustment of the mA and/or kV according to patient size and/or use of iterative reconstruction technique. CONTRAST:  OMNIPAQUE IOHEXOL 300 MG/ML  SOLN COMPARISON:  None Available. FINDINGS: Lower chest: No acute abnormality. Hepatobiliary: No focal liver abnormality. No gallstones, gallbladder wall thickening, or pericholecystic fluid. No biliary dilatation. Pancreas: No focal lesion. Normal pancreatic contour. No surrounding inflammatory changes. No main pancreatic ductal dilatation. Spleen: Normal in size without focal abnormality. Adrenals/Urinary Tract: No adrenal nodule bilaterally. Bilateral kidneys enhance symmetrically. No hydronephrosis. No hydroureter. The urinary bladder is unremarkable. Stomach/Bowel: Stomach is within normal limits. No evidence of bowel wall thickening or dilatation. Appendix appears normal. Vascular/Lymphatic: No abdominal aorta or iliac aneurysm. No abdominal, pelvic, or  inguinal lymphadenopathy. Reproductive: Prostate is unremarkable. Other: No intraperitoneal free fluid. No intraperitoneal free gas. No organized fluid collection. Musculoskeletal: Tiny fat containing umbilical hernia. No suspicious lytic or blastic osseous lesions. No acute displaced fracture. IMPRESSION: No acute intra-abdominal or intrapelvic abnormality. Electronically Signed   By: Tish Frederickson M.D.   On: 09/13/2022 22:58    Procedures Procedures    Medications Ordered in ED Medications  dicyclomine (BENTYL) injection 20 mg (20 mg Intramuscular Given 09/13/22 2226)  sodium chloride 0.9 % bolus 1,000 mL (1,000 mLs Intravenous New Bag/Given 09/13/22 2225)  ondansetron (ZOFRAN) injection 4 mg (4 mg Intravenous Given 09/13/22 2225)  iohexol (OMNIPAQUE) 300 MG/ML solution 100 mL (100 mLs Intravenous Contrast Given 09/13/22 2236)    ED Course/ Medical Decision Making/ A&P Clinical Course as of 09/13/22 2342  Sat Sep 13, 2022  2315 Amphetamines(!): POSITIVE [JS]    Clinical Course User Index [JS] Claude Manges, PA-C  Medical Decision Making Amount and/or Complexity of Data Reviewed Labs: ordered. Decision-making details documented in ED Course. Radiology: ordered.  Risk Prescription drug management.   This patient presents to the ED for concern of abdominal pain, diarrhea, this involves a number of treatment options, and is a complaint that carries with it a high risk of complications and morbidity.  The differential diagnosis includes diverticulitis, ulcerative colitis flareup, versus viral illness.   Co morbidities: Discussed in HPI   Brief History:  Patient with lower abdominal pain over the last couple of days after suspicious food intake with Janine Limbo.  1 episode of manage without any blood in his emesis.  Continues to endorse pain along the left lower quadrant.  Explosive diarrhea with blood in his stool.  EMR reviewed including pt PMHx, past  surgical history and past visits to ER.   See HPI for more details   Lab Tests:  I ordered and independently interpreted labs.  The pertinent results include:    I personally reviewed all laboratory work and imaging. Metabolic panel without any acute abnormality specifically kidney function within normal limits and no significant electrolyte abnormalities. CBC without leukocytosis or significant anemia.   Imaging Studies:  {Blank single:19197::"NAD. I personally reviewed all imaging studies and no acute abnormality found. I agree with radiology interpretation.","Abnormal findings. I personally reviewed all imaging studies. Imaging notable for","No imaging studies ordered for this patient"}    Cardiac Monitoring:  {Blank single:19197::"The patient was maintained on a cardiac monitor.  I personally viewed and interpreted the cardiac monitored which showed an underlying rhythm of:","NA"} {Blank single:19197::"EKG non-ischemic","NA"}   Medicines ordered:  I ordered medication including ***  for *** Reevaluation of the patient after these medicines showed that the patient {resolved/improved/worsened:23923::"improved"} I have reviewed the patients home medicines and have made adjustments as needed   Critical Interventions:  ***   Consults:  I requested consultation with ***,  and discussed lab and imaging findings as well as pertinent plan - they recommend: ***    Reevaluation:  After the interventions noted above I re-evaluated patient and found that they have :{resolved/improved/worsened:23923::"improved"}   Social Determinants of Health:  {Blank single:19197::"Given cab voucher","Social work/case management involved","The patient's social determinants of health were a factor in the care of this patient"}    Problem List / ED Course:  ***   Dispostion:  After consideration of the diagnostic results and the patients response to treatment, I feel that the patent  would benefit from ***     Portions of this note were generated with [employer] dictation software. Dictation errors may occur despite best attempts at proofreading.   Final Clinical Impression(s) / ED Diagnoses Final diagnoses:  Left lower quadrant abdominal pain  Viral gastroenteritis    Rx / DC Orders ED Discharge Orders     None

## 2022-09-13 NOTE — Discharge Instructions (Addendum)
Your laboratories also are within normal limits today.  The CT of your abdomen did not show any acute findings.  You may continue taking over-the-counter medication to help with your symptoms.

## 2022-09-13 NOTE — ED Triage Notes (Signed)
Pt POV steady gait-  C/o ate food 2 days ago, upset stomach afterwards--> diarrhea. Hx of ulcerative colitis.  Tolerating PO intake.  Also reports BRBPR x2 days.

## 2022-09-20 ENCOUNTER — Emergency Department (HOSPITAL_COMMUNITY)
Admission: EM | Admit: 2022-09-20 | Discharge: 2022-09-20 | Disposition: A | Discharge status: Home / Self Care | Payer: Self-pay | Attending: Emergency Medicine | Admitting: Emergency Medicine

## 2022-09-20 DIAGNOSIS — F1910 Other psychoactive substance abuse, uncomplicated: Secondary | ICD-10-CM

## 2022-09-20 DIAGNOSIS — F1510 Other stimulant abuse, uncomplicated: Secondary | ICD-10-CM | POA: Insufficient documentation

## 2022-09-20 MED ORDER — LORAZEPAM 1 MG PO TABS
1.0000 mg | ORAL_TABLET | Freq: Once | ORAL | Status: AC
  Administered 2022-09-20: 1 mg via ORAL
  Filled 2022-09-20: qty 1

## 2022-09-20 NOTE — ED Provider Notes (Signed)
[facility] Provider Note   CSN: [id number] Arrival date & time: 09/20/22  1151     History  Chief Complaint  Patient presents with   Drug Overdose    Gregory Woodward is a 37 y.o. male.  37 year old male who presents due to ingested methamphetamines.  He denies SI or HI.  Patient states he last used methamphetamines approximate 10 hours ago.  Was in a hotel when he asked police for assistance.  Upon EMS arrival, patient was alert and oriented.  Patient denies any other ingestions.  Blood sugar was above 100.  Here for further evaluation       Home Medications Prior to Admission medications   Medication Sig Start Date End Date Taking? Authorizing Provider  cyclobenzaprine (FLEXERIL) 10 MG tablet Take 1 tablet (10 mg total) by mouth 2 (two) times daily as needed for muscle spasms. Patient not taking: Reported on 05/30/2022 04/05/22   Azucena Cecil, PA-C  doxycycline (VIBRAMYCIN) 100 MG capsule Take 1 capsule (100 mg total) by mouth 2 (two) times daily. Patient not taking: Reported on 05/30/2022 05/15/21   Charlann Lange, PA-C      Allergies    Onion    Review of Systems   Review of Systems  All other systems reviewed and are negative.   Physical Exam Updated Vital Signs BP 119/76 (BP Location: Left Arm)   Pulse 100   Temp 97.8 F (36.6 C) (Oral)   Resp 14   Ht 1.829 m (6')   Wt 90.7 kg   SpO2 100%   BMI 27.12 kg/m  Physical Exam Vitals and nursing note reviewed.  Constitutional:      General: He is not in acute distress.    Appearance: Normal appearance. He is well-developed. He is not toxic-appearing.  HENT:     Head: Normocephalic and atraumatic.  Eyes:     General: Lids are normal.     Conjunctiva/sclera: Conjunctivae normal.     Pupils: Pupils are equal, round, and reactive to light.  Neck:     Thyroid: No thyroid mass.     Trachea: No tracheal deviation.  Cardiovascular:     Rate and Rhythm: Normal rate  and regular rhythm.     Heart sounds: Normal heart sounds. No murmur heard.    No gallop.  Pulmonary:     Effort: Pulmonary effort is normal. No respiratory distress.     Breath sounds: Normal breath sounds. No stridor. No decreased breath sounds, wheezing, rhonchi or rales.  Abdominal:     General: There is no distension.     Palpations: Abdomen is soft.     Tenderness: There is no abdominal tenderness. There is no rebound.  Musculoskeletal:        General: No tenderness. Normal range of motion.     Cervical back: Normal range of motion and neck supple.  Skin:    General: Skin is warm and dry.     Findings: No abrasion or rash.  Neurological:     General: No focal deficit present.     Mental Status: He is alert and oriented to person, place, and time. Mental status is at baseline.     GCS: GCS eye subscore is 4. GCS verbal subscore is 5. GCS motor subscore is 6.     Cranial Nerves: No cranial nerve deficit.     Sensory: No sensory deficit.     Motor: Motor function is intact.  Psychiatric:  Attention and Perception: Attention normal.        Speech: Speech is rapid and pressured.        Behavior: Behavior is hyperactive.     ED Results / Procedures / Treatments   Labs (all labs ordered are listed, but only abnormal results are displayed) Labs Reviewed - No data to display  EKG None  Radiology No results found.  Procedures Procedures    Medications Ordered in ED Medications  LORazepam (ATIVAN) tablet 1 mg (has no administration in time range)    ED Course/ Medical Decision Making/ A&P                             Medical Decision Making Risk Prescription drug management.   Patient given Ativan now feels better.  Was allowed to rest and upon awaking he is alert and oriented x 4.  He is stable for discharge        Final Clinical Impression(s) / ED Diagnoses Final diagnoses:  None    Rx / DC Orders ED Discharge Orders     None          Lacretia Leigh, MD 09/20/22 1410

## 2022-09-20 NOTE — ED Notes (Signed)
Pt refused last set of VS. Ambulatory to lobby stating his sister was here.

## 2022-09-20 NOTE — ED Triage Notes (Signed)
Pt brought in by [facility]. He took some meth and heroine last night and approached Police who called EMS to bring him in. BP 138/82 HR 110 SPO2 100%  CBG 106

## 2022-10-21 ENCOUNTER — Emergency Department (HOSPITAL_BASED_OUTPATIENT_CLINIC_OR_DEPARTMENT_OTHER)
Admission: EM | Admit: 2022-10-21 | Discharge: 2022-10-21 | Disposition: A | Discharge status: Home / Self Care | Payer: Self-pay | Attending: Emergency Medicine | Admitting: Emergency Medicine

## 2022-10-21 ENCOUNTER — Other Ambulatory Visit: Payer: Self-pay

## 2022-10-21 DIAGNOSIS — Z87891 Personal history of nicotine dependence: Secondary | ICD-10-CM | POA: Insufficient documentation

## 2022-10-21 DIAGNOSIS — L03211 Cellulitis of face: Secondary | ICD-10-CM | POA: Insufficient documentation

## 2022-10-21 MED ORDER — NAPROXEN 500 MG PO TABS: 500.0000 mg | ORAL_TABLET | Freq: Two times a day (BID) | ORAL | 0 refills | Status: DC

## 2022-10-21 MED ORDER — AMOXICILLIN-POT CLAVULANATE 875-125 MG PO TABS: 1.0000 | ORAL_TABLET | Freq: Two times a day (BID) | ORAL | 0 refills | Status: AC

## 2022-10-21 MED ORDER — SULFAMETHOXAZOLE-TRIMETHOPRIM 800-160 MG PO TABS: 1.0000 | ORAL_TABLET | Freq: Two times a day (BID) | ORAL | 0 refills | Status: AC

## 2022-10-21 MED ORDER — AMOXICILLIN-POT CLAVULANATE 875-125 MG PO TABS
1.0000 | ORAL_TABLET | Freq: Once | ORAL | Status: AC
  Administered 2022-10-21: 1 via ORAL
  Filled 2022-10-21: qty 1

## 2022-10-21 MED ORDER — SULFAMETHOXAZOLE-TRIMETHOPRIM 800-160 MG PO TABS
1.0000 | ORAL_TABLET | Freq: Once | ORAL | Status: AC
  Administered 2022-10-21: 1 via ORAL
  Filled 2022-10-21: qty 1

## 2022-10-21 MED ORDER — LIDOCAINE HCL URETHRAL/MUCOSAL 2 % EX GEL
1.0000 | Freq: Once | CUTANEOUS | Status: AC
  Administered 2022-10-21: 1 via TOPICAL
  Filled 2022-10-21: qty 11

## 2022-10-21 NOTE — Discharge Instructions (Signed)
You were evaluated in the Emergency Department and after careful evaluation, we did not find any emergent condition requiring admission or further testing in the hospital.  Your exam/testing today is overall reassuring.  Symptoms seem to be due to a cellulitis of the face.  Important to take both antibiotics as directed.  Use the Naprosyn anti-inflammatory for pain.  Please return to the Emergency Department if you experience any worsening of your condition.   Thank you for allowing [place] to be a part of your care.

## 2022-10-21 NOTE — ED Provider Notes (Signed)
[facility] Emergency Department Provider Note MRN:  [id number]  Arrival date & time: 10/21/22     Chief Complaint   Facial Cellulitis   History of Present Illness   Gregory Woodward is a 37 y.o. year-old male with a history of ulcerative colitis presenting to the ED with chief complaint of facial cellulitis.  Cut himself shaving a few days ago and has had worsening redness and swelling and pain from the site where he cut himself.  Worried that there may be a pocket of infection near where he cut the bridge of his nose.  Review of Systems  A thorough review of systems was obtained and all systems are negative except as noted in the HPI and PMH.   Patient's Health History    Past Medical History:  Diagnosis Date   Cellulitis 2021   Chronic abdominal pain    Ulcerative colitis ([place])     No past surgical history on file.  No family history on file.  Social History   Socioeconomic History   Marital status: Single    Spouse name: Not on file   Number of children: Not on file   Years of education: Not on file   Highest education level: Not on file  Occupational History   Not on file  Tobacco Use   Smoking status: Former   Smokeless tobacco: Never  Vaping Use   Vaping Use: Never used  Substance and Sexual Activity   Alcohol use: No   Drug use: Not Currently   Sexual activity: Not on file  Other Topics Concern   Not on file  Social History Narrative   Not on file   Social Determinants of Health   Financial Resource Strain: Not on file  Food Insecurity: Not on file  Transportation Needs: Not on file  Physical Activity: Not on file  Stress: Not on file  Social Connections: Not on file  Intimate Partner Violence: Not on file     Physical Exam   Vitals:   10/21/22 0609  BP: 131/82  Pulse: (!) 112  Resp: 18  Temp: 98.1 F (36.7 C)  SpO2: 97%    CONSTITUTIONAL: Well-appearing, NAD NEURO/PSYCH:  Alert and oriented x 3, no focal  deficits EYES:  eyes equal and reactive ENT/NECK:  no LAD, no JVD CARDIO: Regular rate, well-perfused, normal S1 and S2 PULM:  CTAB no wheezing or rhonchi GI/GU:  non-distended, non-tender MSK/SPINE:  No gross deformities, no edema SKIN: Erythema and induration spreading from the bridge of the nose to the right periorbital region   *Additional and/or pertinent findings included in MDM below  Diagnostic and Interventional Summary    EKG Interpretation  Date/Time:    Ventricular Rate:    PR Interval:    QRS Duration:   QT Interval:    QTC Calculation:   R Axis:     Text Interpretation:         Labs Reviewed - No data to display  No orders to display    Medications  sulfamethoxazole-trimethoprim (BACTRIM DS) 800-160 MG per tablet 1 tablet (1 tablet Oral Given 10/21/22 0621)  amoxicillin-clavulanate (AUGMENTIN) 875-125 MG per tablet 1 tablet (1 tablet Oral Given 10/21/22 0621)  lidocaine (XYLOCAINE) 2 % jelly 1 Application (1 Application Topical Given 10/21/22 [id number])     Procedures  /  Critical Care .Marland KitchenIncision and Drainage  Date/Time: 10/21/2022 6:53 AM  Performed by: Maudie Flakes, MD Authorized by: Maudie Flakes, MD   Consent:  Consent obtained:  Verbal   Consent given by:  Patient   Risks, benefits, and alternatives were discussed: yes     Risks discussed:  Bleeding, damage to other organs, infection, incomplete drainage and pain   Alternatives discussed:  No treatment Universal protocol:    Procedure explained and questions answered to patient or proxy's satisfaction: yes     Immediately prior to procedure, a time out was called: yes     Patient identity confirmed:  Verbally with patient Location:    Type:  Abscess   Size:  0.5cm   Location: Face. Pre-procedure details:    Skin preparation:  Chlorhexidine with alcohol Sedation:    Sedation type:  None Anesthesia:    Anesthesia method:  Topical application   Topical anesthetic:  Lidocaine gel Procedure  type:    Complexity:  Simple Procedure details:    Needle aspiration: yes     Needle size:  18 G   Drainage:  Bloody and purulent   Drainage amount:  Scant   Wound treatment:  Wound left open   Packing materials:  None Post-procedure details:    Procedure completion:  Tolerated well, no immediate complications   ED Course and Medical Decision Making  Initial Impression and Ddx Exam is consistent with facial cellulitis and preseptal cellulitis.  He has no vision loss, no pain with range of motion of the eyes, highly doubt orbital cellulitis.  No fever.  Visual acuity intact.  Patient concerned about a possible abscess where he cut himself.  Aspiration performed with small amount of purulence removed, the area was expressed as much as possible, doubt any retained purulent material.  Past medical/surgical history that increases complexity of ED encounter: None  Interpretation of Diagnostics Laboratory and/or imaging options to aid in the diagnosis/care of the patient were considered.  After careful history and physical examination, it was determined that there was no indication for diagnostics at this time.  Patient Reassessment and Ultimate Disposition/Management     Patient appropriate for discharge, given strict return precautions for pain with movement of the eye, vision loss, fever.  Patient management required discussion with the following services or consulting groups:  None  Complexity of Problems Addressed Acute illness or injury that poses threat of life of bodily function  Additional Data Reviewed and Analyzed Further history obtained from: None  Additional Factors Impacting ED Encounter Risk Prescriptions and Minor Procedures  Barth Kirks. Sedonia Small, [place] [email or web address]  Final Clinical Impressions(s) / ED Diagnoses     ICD-10-CM   1. Facial cellulitis  L03.211       ED Discharge Orders          Ordered     amoxicillin-clavulanate (AUGMENTIN) 875-125 MG tablet  Every 12 hours        10/21/22 0653    sulfamethoxazole-trimethoprim (BACTRIM DS) 800-160 MG tablet  2 times daily        10/21/22 0653    naproxen (NAPROSYN) 500 MG tablet  2 times daily        10/21/22 [id number]             Discharge Instructions Discussed with and Provided to Patient:    Discharge Instructions      You were evaluated in the Emergency Department and after careful evaluation, we did not find any emergent condition requiring admission or further testing in the hospital.  Your exam/testing today is overall reassuring.  Symptoms seem to be due to  a cellulitis of the face.  Important to take both antibiotics as directed.  Use the Naprosyn anti-inflammatory for pain.  Please return to the Emergency Department if you experience any worsening of your condition.   Thank you for allowing [place] to be a part of your care.      Maudie Flakes, MD 10/21/22 [phone]

## 2022-10-21 NOTE — ED Triage Notes (Signed)
Pt in with redness, warmth and swelling to R upper eye. Pt states 2 days ago, he was using a razor to shave inside his R eyebrow and knicked just to the right of the top of his nose bridge. Has been using neosporin on the cut, but swelling was worse when he woke this morning.

## 2023-07-03 IMAGING — DX DG TOE 5TH 2+V*R*
3 series · 3 of 3 positions shown · non-contrast
Comparison: None.

CLINICAL DATA: Fifth right toe pain and swelling.

EXAM:
RIGHT FIFTH TOE

[toe ap]
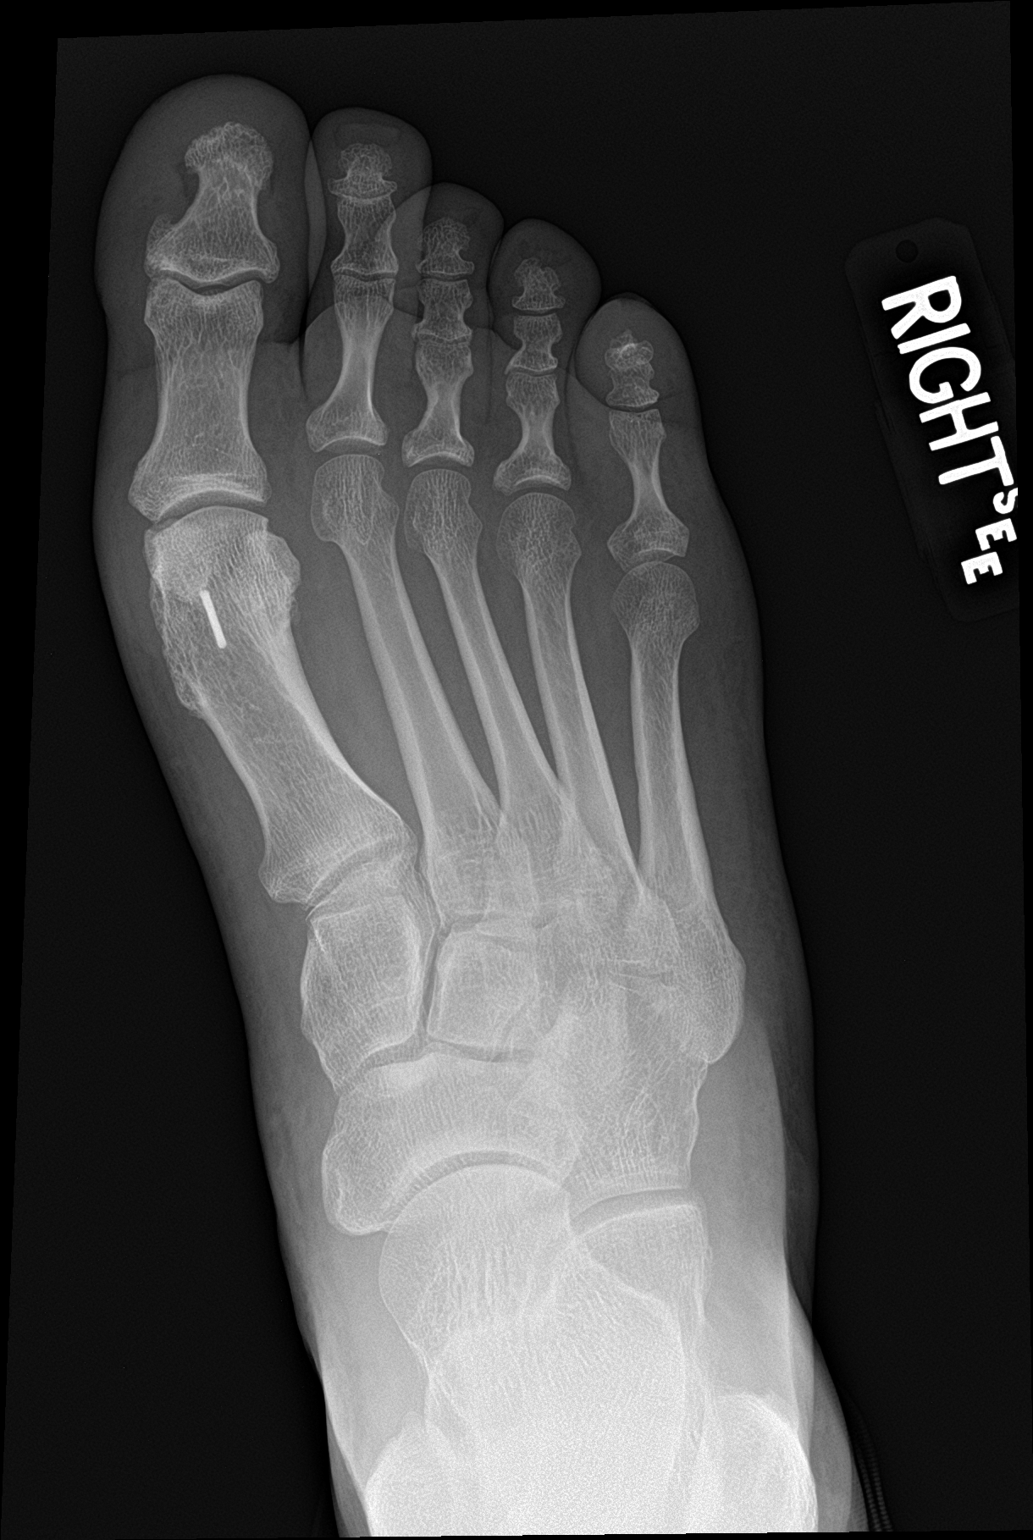

[toe obl]
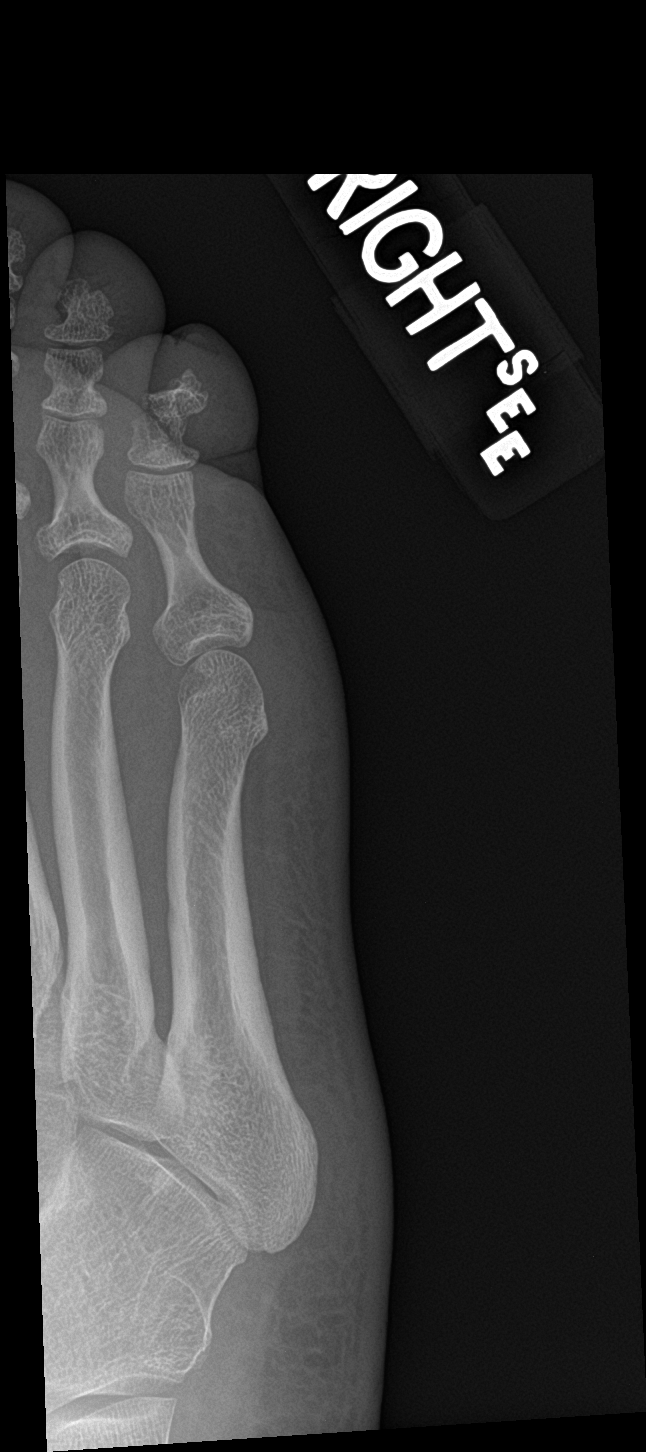

[toe lat]
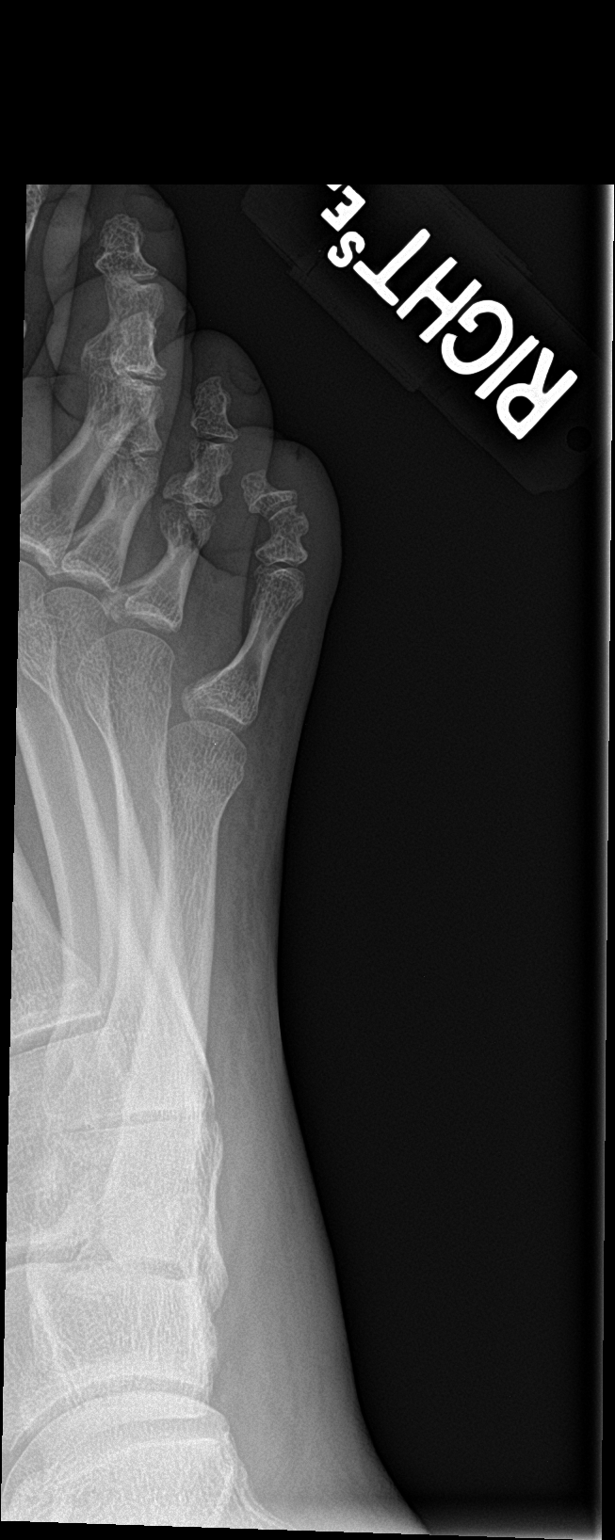

[3 of 3 positions shown; findings below may reference images not displayed]

FINDINGS: There is no evidence of fracture or dislocation. A 9 mm linear
radiopaque foreign body is seen overlying the distal aspect of the
first right metatarsal. There is no evidence of arthropathy or other
focal bone abnormality. Moderate severity diffuse soft tissue
swelling of the fifth right toe is noted.
IMPRESSION: Soft tissue swelling of the fifth right toe without evidence of an
acute osseous abnormality.

## 2023-10-26 ENCOUNTER — Emergency Department (HOSPITAL_COMMUNITY)
Admission: EM | Admit: 2023-10-26 | Discharge: 2023-10-26 | Disposition: A | Discharge status: Home / Self Care | Payer: No Typology Code available for payment source | Attending: Emergency Medicine | Admitting: Emergency Medicine

## 2023-10-26 ENCOUNTER — Encounter (HOSPITAL_COMMUNITY): Payer: Self-pay | Admitting: Emergency Medicine

## 2023-10-26 ENCOUNTER — Other Ambulatory Visit: Payer: Self-pay

## 2023-10-26 DIAGNOSIS — K047 Periapical abscess without sinus: Secondary | ICD-10-CM | POA: Diagnosis not present

## 2023-10-26 DIAGNOSIS — K0889 Other specified disorders of teeth and supporting structures: Secondary | ICD-10-CM | POA: Diagnosis present

## 2023-10-26 DIAGNOSIS — K0381 Cracked tooth: Secondary | ICD-10-CM | POA: Diagnosis not present

## 2023-10-26 MED ORDER — AMOXICILLIN 500 MG PO CAPS
500.0000 mg | ORAL_CAPSULE | Freq: Once | ORAL | Status: AC
  Administered 2023-10-26: 500 mg via ORAL
  Filled 2023-10-26: qty 1

## 2023-10-26 MED ORDER — AMOXICILLIN 500 MG PO CAPS: 500.0000 mg | ORAL_CAPSULE | Freq: Three times a day (TID) | ORAL | 0 refills | Status: AC

## 2023-10-26 MED ORDER — IBUPROFEN 800 MG PO TABS: 800.0000 mg | ORAL_TABLET | Freq: Three times a day (TID) | ORAL | 0 refills | Status: AC

## 2023-10-26 MED ORDER — KETOROLAC TROMETHAMINE 30 MG/ML IJ SOLN
30.0000 mg | Freq: Once | INTRAMUSCULAR | Status: AC
  Administered 2023-10-26: 30 mg via INTRAMUSCULAR
  Filled 2023-10-26: qty 1

## 2023-10-26 NOTE — ED Notes (Signed)
Patient verbalizes understanding of discharge instructions. Opportunity for questioning and answers were provided. Armband removed by staff, pt discharged from ED. Ambulated out to lobby  

## 2023-10-26 NOTE — ED Provider Notes (Signed)
 [facility] Emergency Department Provider Note MRN:  [id number]  Arrival date & time: 10/26/23     Chief Complaint   Dental Pain and Facial Swelling   History of Present Illness   Gregory Woodward is a 38 y.o. year-old male presents to the ED with chief complaint of left upper dental pain.  Patient states that he was just released from prison this is the first chance he has had to have his tooth evaluated.  He has a broken and cracked tooth to the left upper side.  He has not taken any medicine for his symptoms.  He does not have a dentist.  He states that he noticed some swelling in his cheek earlier today.  He reports associated pain.  History provided by patient.   Review of Systems  Pertinent positive and negative review of systems noted in HPI.    Physical Exam   Vitals:   10/26/23 2227 10/26/23 2258  BP: (!) 123/94   Pulse: (!) 120 (!) 112  Resp: 18   Temp: 98.2 F (36.8 C) 99.1 F (37.3 C)  SpO2: 100%     CONSTITUTIONAL:  non toxic-appearing, NAD NEURO:  Alert and oriented x 3, CN 3-12 grossly intact EYES:  eyes equal and reactive ENT/NECK:  Supple, no stridor, poor dentition, cracked and broken upper left molars, no drainable abscess on exam CARDIO:  tachycardic, appears well-perfused  PULM:  No respiratory distress,  GI/GU:  non-distended,  MSK/SPINE:  No gross deformities, no edema, moves all extremities  SKIN:  no rash, atraumatic   *Additional and/or pertinent findings included in MDM below  Diagnostic and Interventional Summary    EKG Interpretation Date/Time:    Ventricular Rate:    PR Interval:    QRS Duration:    QT Interval:    QTC Calculation:   R Axis:      Text Interpretation:         Labs Reviewed - No data to display  No orders to display    Medications  ketorolac (TORADOL) 30 MG/ML injection 30 mg (has no administration in time range)  amoxicillin (AMOXIL) capsule 500 mg (has no administration in time range)      Procedures  /  Critical Care Procedures  ED Course and Medical Decision Making  I have reviewed the triage vital signs, the nursing notes, and pertinent available records from the EMR.  Social Determinants Affecting Complexity of Care: Patient has no clinically significant social determinants affecting this chief complaint..   ED Course:    Medical Decision Making Patient with dentalgia.  No abscess requiring immediate incision and drainage.  Exam not concerning for Ludwig's angina or pharyngeal abscess.  Will treat with amox and toradol IM. Pt instructed to follow-up with dentist.  Discussed return precautions. Pt safe for discharge.   Risk Prescription drug management.         Consultants: No consultations were needed in caring for this patient.   Treatment and Plan: Emergency department workup does not suggest an emergent condition requiring admission or immediate intervention beyond  what has been performed at this time. The patient is safe for discharge and has  been instructed to return immediately for worsening symptoms, change in  symptoms or any other concerns    Final Clinical Impressions(s) / ED Diagnoses     ICD-10-CM   1. Pain, dental  K08.89     2. Dental infection  K04.7       ED Discharge Orders  Ordered    amoxicillin (AMOXIL) 500 MG capsule  3 times daily        10/26/23 2325    ibuprofen (ADVIL) 800 MG tablet  3 times daily        10/26/23 2325              Discharge Instructions Discussed with and Provided to Patient:   Discharge Instructions   None      Felipa Furnace 10/26/23 2330    Tilden Fossa, MD 10/27/23 [phone]

## 2023-10-26 NOTE — ED Triage Notes (Signed)
 Pt in with L upper mouth dental pain, swelling now into L cheek since this morning.

## 2024-10-05 ENCOUNTER — Emergency Department (HOSPITAL_COMMUNITY)
Admission: EM | Admit: 2024-10-05 | Discharge: 2024-10-06 | Attending: Emergency Medicine | Admitting: Emergency Medicine

## 2024-10-05 ENCOUNTER — Encounter (HOSPITAL_COMMUNITY): Payer: Self-pay | Admitting: *Deleted

## 2024-10-05 ENCOUNTER — Other Ambulatory Visit: Payer: Self-pay

## 2024-10-05 ENCOUNTER — Emergency Department (HOSPITAL_COMMUNITY)

## 2024-10-05 DIAGNOSIS — Z5321 Procedure and treatment not carried out due to patient leaving prior to being seen by health care provider: Secondary | ICD-10-CM | POA: Insufficient documentation

## 2024-10-05 DIAGNOSIS — M79672 Pain in left foot: Secondary | ICD-10-CM | POA: Insufficient documentation

## 2024-10-05 LAB — CBC
HCT: 45.8 % (ref 39.0–52.0)
Hemoglobin: 15 g/dL (ref 13.0–17.0)
MCH: 25.7 pg — ABNORMAL LOW (ref 26.0–34.0)
MCHC: 32.8 g/dL (ref 30.0–36.0)
MCV: 78.6 fL — ABNORMAL LOW (ref 80.0–100.0)
Platelets: 277 10*3/uL (ref 150–400)
RBC: 5.83 MIL/uL — ABNORMAL HIGH (ref 4.22–5.81)
RDW: 14 % (ref 11.5–15.5)
WBC: 6.8 10*3/uL (ref 4.0–10.5)
nRBC: 0 % (ref 0.0–0.2)

## 2024-10-05 LAB — I-STAT CHEM 8, ED
BUN: 13 mg/dL (ref 6–20)
Calcium, Ion: 1.1 mmol/L — ABNORMAL LOW (ref 1.15–1.40)
Chloride: 104 mmol/L (ref 98–111)
Creatinine, Ser: 1.1 mg/dL (ref 0.61–1.24)
Glucose, Bld: 71 mg/dL (ref 70–99)
HCT: 45 % (ref 39.0–52.0)
Hemoglobin: 15.3 g/dL (ref 13.0–17.0)
Potassium: 5.9 mmol/L — ABNORMAL HIGH (ref 3.5–5.1)
Sodium: 140 mmol/L (ref 135–145)
TCO2: 27 mmol/L (ref 22–32)

## 2024-10-05 NOTE — ED Triage Notes (Signed)
 The pt is c/o pain in the bottom of his lt foot where he  stepped on a nail that went through the bottom of his shoe 3-4 days ago  today he had a thick purulent drainage coming from the wound  no temp

## 2024-10-06 NOTE — ED Notes (Signed)
 Called pt 4x for vitals, and received no response.
